# Patient Record
Sex: Female | Born: 2000 | Race: Black or African American | Hispanic: No | Marital: Single
Health system: Midwestern US, Community
[De-identification: ages and names within clinical notes are randomized; demographics above are authoritative.]

## PROBLEM LIST (undated history)

## (undated) ENCOUNTER — Inpatient Hospital Stay (HOSPITAL_COMMUNITY): Payer: Self-pay

## (undated) DIAGNOSIS — Z789 Other specified health status: Secondary | ICD-10-CM

## (undated) HISTORY — PX: NO PAST SURGERIES: SHX2092

---
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
## 2022-01-07 ENCOUNTER — Ambulatory Visit (HOSPITAL_COMMUNITY)
Admission: EM | Admit: 2022-01-07 | Discharge: 2022-01-07 | Disposition: A | Discharge status: Home / Self Care | Payer: Medicaid Other | Attending: Nurse Practitioner | Admitting: Nurse Practitioner

## 2022-01-07 ENCOUNTER — Encounter (HOSPITAL_COMMUNITY): Payer: Self-pay | Admitting: Emergency Medicine

## 2022-01-07 ENCOUNTER — Other Ambulatory Visit: Payer: Self-pay

## 2022-01-07 DIAGNOSIS — M545 Low back pain, unspecified: Secondary | ICD-10-CM | POA: Diagnosis present

## 2022-01-07 DIAGNOSIS — R110 Nausea: Secondary | ICD-10-CM | POA: Diagnosis not present

## 2022-01-07 DIAGNOSIS — R519 Headache, unspecified: Secondary | ICD-10-CM | POA: Diagnosis present

## 2022-01-07 LAB — POCT URINALYSIS DIPSTICK, ED / UC
Glucose, UA: NEGATIVE mg/dL
Hgb urine dipstick: NEGATIVE
Ketones, ur: >=160 mg/dL — AB
Leukocytes,Ua: NEGATIVE
Nitrite: NEGATIVE
Protein, ur: 100 mg/dL — AB
Specific Gravity, Urine: 1.025 (ref 1.005–1.030)
Urobilinogen, UA: 0.2 mg/dL (ref 0.0–1.0)
pH: 6 (ref 5.0–8.0)

## 2022-01-07 LAB — POC URINE PREG, ED: Preg Test, Ur: NEGATIVE

## 2022-01-07 LAB — POC INFLUENZA A AND B ANTIGEN (URGENT CARE ONLY)
INFLUENZA A ANTIGEN, POC: NEGATIVE
INFLUENZA B ANTIGEN, POC: NEGATIVE

## 2022-01-07 MED ORDER — IBUPROFEN 600 MG PO TABS: 600.0000 mg | ORAL_TABLET | Freq: Four times a day (QID) | ORAL | 0 refills | Status: AC | PRN

## 2022-01-07 MED ORDER — ONDANSETRON 4 MG PO TBDP: 4.0000 mg | ORAL_TABLET | Freq: Three times a day (TID) | ORAL | 0 refills | Status: DC | PRN

## 2022-01-07 MED ORDER — METRONIDAZOLE 0.75 % VA GEL: 1.0000 | Freq: Every day | VAGINAL | 0 refills | Status: AC

## 2022-01-07 NOTE — Discharge Instructions (Addendum)
Negative influenza test today. ?You will be contacted if your cytology results are positive. ?Take medication as prescribed.  Take ibuprofen with food and water to protect stomach lining. ?Increase fluids and get plenty of rest.   ?It is important that you try to start eating.  I recommend a bland diet until your nausea improves to include things like soups, broths, bananas, rice, applesauce or toast. ?Follow-up if symptoms do not improve. ? ?

## 2022-01-07 NOTE — ED Provider Notes (Addendum)
[facility]    CSN: [id number] Arrival date & time: 01/07/22  7622      History   Chief Complaint Chief Complaint  Patient presents with   Abdominal Pain   Nausea   Headache   Back Pain    HPI Janice Huynh is a 21 y.o. female.   The patient is a 21 year old female who presents with headache, nausea, and back pain for 1 day.  She also has complaints of lower abdominal pain and vaginal discharge for 1 day.  She denies fever, chills, cough, nasal congestion, runny nose.  States that she has not been able to eat all day today.  She has not taken any medication for her symptoms.  She denies any sick contacts.  Pain is located in the lower back.  She also reports an onset of vaginal discharge since yesterday.  She describes the discharge as white and the discharge has a foul smelling odor.  She has vaginal itching and abdominal cramping.  She has had the Nexplanon for more than 3 months but less than 6 months.  She denies any complications with this method of birth control.  She is currently sexually active.  She has had 1 female partner in the last 90 days with 0% condom use.  She is currently on the Nexplanon implant.  She does have a history of chlamydia.   Abdominal Pain Associated symptoms: fatigue, nausea and vaginal discharge   Associated symptoms: no dysuria, no fever, no sore throat and no vomiting   Headache Associated symptoms: abdominal pain, back pain, fatigue and nausea   Associated symptoms: no congestion, no fever, no sore throat and no vomiting   Back Pain Associated symptoms: abdominal pain and headaches   Associated symptoms: no dysuria and no fever    Past Medical History:  Diagnosis Date   Medical history non-contributory     Patient Active Problem List   Diagnosis Date Noted   IDA (iron deficiency anemia) 05/19/2020   Postpartum care following vaginal delivery 7/17 05/18/2020   SVD (spontaneous vaginal delivery) 05/18/2020   Perineal  laceration, second degree 05/18/2020   Hypokalemia 05/18/2020   Gestational (pregnancy-induced) hypertension without significant proteinuria, complicating childbirth 05/17/2020   Bilateral kidney stones 01/25/2020   Bacterial vaginosis 10/15/2018    Past Surgical History:  Procedure Laterality Date   NO PAST SURGERIES      OB History     Gravida  2   Para  1   Term  1   Preterm      AB  1   Living  1      SAB  1   IAB      Ectopic      Multiple  0   Live Births  1            Home Medications    Prior to Admission medications   Medication Sig Start Date End Date Taking? Authorizing Provider  ibuprofen (ADVIL) 600 MG tablet Take 1 tablet (600 mg total) by mouth every 6 (six) hours as needed for up to 7 days. 01/07/22 01/14/22 Yes Trina Asch-Warren, Sadie Haber, NP  ondansetron (ZOFRAN-ODT) 4 MG disintegrating tablet Take 1 tablet (4 mg total) by mouth every 8 (eight) hours as needed for up to 7 days for nausea or vomiting. 01/07/22 01/14/22 Yes Sundeep Cary-Warren, Sadie Haber, NP  benzonatate (TESSALON PERLES) 100 MG capsule Take 1 capsule (100 mg total) by mouth 3 (three) times daily as needed for cough (  cough). 04/15/21   Muthersbaugh, Dahlia ClientHannah, PA-C  cephALEXin (KEFLEX) 500 MG capsule Take 1 capsule (500 mg total) by mouth 3 (three) times daily. 10/09/21   Raspet, Noberto RetortErin K, PA-C  fluticasone (FLONASE) 50 MCG/ACT nasal spray Place 2 sprays into both nostrils daily. 04/15/21   Muthersbaugh, Dahlia ClientHannah, PA-C  iron polysaccharides (NIFEREX) 150 MG capsule Take 1 capsule (150 mg total) by mouth daily. 05/20/20   Crumpler, Lilyan GilfordJennifer B, CNM  metroNIDAZOLE (FLAGYL) 500 MG tablet Take 1 tablet (500 mg total) by mouth 2 (two) times daily. 12/29/21   Lamptey, Britta MccreedyPhilip O, MD    Family History Family History  Problem Relation Age of Onset   Hypertension Mother    Multiple sclerosis Mother     Social History Social History   Tobacco Use   Smoking status: Never   Smokeless tobacco: Never  Vaping  Use   Vaping Use: Never used  Substance Use Topics   Alcohol use: No   Drug use: Never     Allergies   Patient has no known allergies.   Review of Systems Review of Systems  Constitutional:  Positive for activity change, appetite change and fatigue. Negative for fever.  HENT:  Negative for congestion and sore throat.   Eyes: Negative.   Respiratory: Negative.    Cardiovascular: Negative.   Gastrointestinal:  Positive for abdominal pain and nausea. Negative for vomiting.  Genitourinary:  Positive for vaginal discharge. Negative for dysuria, enuresis and frequency.  Musculoskeletal:  Positive for back pain.  Skin: Negative.   Neurological:  Positive for headaches.  Psychiatric/Behavioral: Negative.      Physical Exam Triage Vital Signs ED Triage Vitals  Enc Vitals Group     BP 01/07/22 1953 110/63     Pulse Rate 01/07/22 1953 90     Resp 01/07/22 1953 16     Temp --      Temp src --      SpO2 01/07/22 1953 100 %     Weight 01/07/22 1954 154 lb 15.7 oz (70.3 kg)     Height 01/07/22 1954 5\' 3"  (1.6 m)     Head Circumference --      Peak Flow --      Pain Score 01/07/22 1954 6     Pain Loc --      Pain Edu? --      Excl. in [place]? --    No data found.  Updated Vital Signs BP 110/63 (BP Location: Right Arm)    Pulse 90    Resp 16    Ht 5\' 3"  (1.6 m)    Wt 154 lb 15.7 oz (70.3 kg)    SpO2 100%    BMI 27.45 kg/m   Visual Acuity Right Eye Distance:   Left Eye Distance:   Bilateral Distance:    Right Eye Near:   Left Eye Near:    Bilateral Near:     Physical Exam Constitutional:      General: She is not in acute distress.    Appearance: She is well-developed.  HENT:     Head: Normocephalic and atraumatic.     Mouth/Throat:     Mouth: Mucous membranes are moist.     Pharynx: No oropharyngeal exudate.  Eyes:     Extraocular Movements: Extraocular movements intact.     Pupils: Pupils are equal, round, and reactive to light.  Cardiovascular:     Rate and  Rhythm: Normal rate and regular rhythm.     Heart sounds:  Normal heart sounds.  Pulmonary:     Effort: Pulmonary effort is normal. No respiratory distress.     Breath sounds: Normal breath sounds.  Abdominal:     General: Bowel sounds are normal. There is no distension.     Palpations: Abdomen is soft.     Tenderness: There is no abdominal tenderness.  Genitourinary:    Comments: Deferred. Self-swab performed by patient. Skin:    General: Skin is warm and dry.  Neurological:     Mental Status: She is alert and oriented to person, place, and time.  Psychiatric:        Mood and Affect: Mood normal.        Behavior: Behavior normal.     UC Treatments / Results  Labs (all labs ordered are listed, but only abnormal results are displayed) Labs Reviewed  POCT URINALYSIS DIPSTICK, ED / UC - Abnormal; Notable for the following components:      Result Value   Bilirubin Urine SMALL (*)    Ketones, ur >=160 (*)    Protein, ur 100 (*)    All other components within normal limits  POC URINE PREG, ED  POC INFLUENZA A AND B ANTIGEN (URGENT CARE ONLY)  CERVICOVAGINAL ANCILLARY ONLY    EKG   Radiology No results found.  Procedures Procedures (including critical care time)  Medications Ordered in UC Medications - No data to display  Initial Impression / Assessment and Plan / UC Course  I have reviewed the triage vital signs and the nursing notes.  Pertinent labs & imaging results that were available during my care of the patient were reviewed by me and considered in my medical decision making (see chart for details).  Negative influenza test today.  Patient without fever, chills, abdominal pain, or vomiting.  No concern for acute abdomen at this time.  Symptoms are generalized in nature and difficult to determine an exact etiology of her symptoms.  We will treat with Zofran to help with her nausea.  Patient encouraged to increase fluids, and hopefully this will help her start  eating.  She has not eaten all day, which is reflected in her urinalysis.  With regard to her vaginal symptoms, will wait for cytology result.  Patient was recently treated for bacterial vaginosis on 12/25/2021, will wait to see if she still has bacterial vaginosis prior to starting another round of this medication.  Patient will be contacted once her cytology results are received if they are positive.  If necessary, we will treat at that time.  In the interim recommend increasing condom use.  Follow-up if symptoms do not improve.  At time of discharge, patient was advised that cytology results will be received prior to prescribing medication for BV as she was just prescribed medication on 12/25/2021.  Patient then advised that she never started the medication because she did not like the way that the medication taste.  Advised that I will send in a prescription for the vaginal gel for BV.  Instructions provided to patient for administration. Final Clinical Impressions(s) / UC Diagnoses   Final diagnoses:  Nausea  Nonintractable headache, unspecified chronicity pattern, unspecified headache type  Acute low back pain without sciatica, unspecified back pain laterality     Discharge Instructions      Negative influenza test today. You will be contacted if your cytology results are positive. Take medication as prescribed.  Take ibuprofen with food and water to protect stomach lining. Increase fluids and get plenty of  rest.   It is important that you try to start eating.  I recommend a bland diet until your nausea improves to include things like soups, broths, bananas, rice, applesauce or toast. Follow-up if symptoms do not improve.      ED Prescriptions     Medication Sig Dispense Auth. Provider   ondansetron (ZOFRAN-ODT) 4 MG disintegrating tablet Take 1 tablet (4 mg total) by mouth every 8 (eight) hours as needed for up to 7 days for nausea or vomiting. 20 tablet [place] Christman-Warren, Sadie Haber, NP    ibuprofen (ADVIL) 600 MG tablet Take 1 tablet (600 mg total) by mouth every 6 (six) hours as needed for up to 7 days. 21 tablet Carr[place] Lightcap-Warren, Sadie Haber, NP      PDMP not reviewed this encounter.   Abran Cantor, NP 01/07/22 2046    Abran Cantor, NP 01/07/22 2051

## 2022-01-07 NOTE — ED Triage Notes (Signed)
Pt reports abdominal pain with nausea, headache and back pain since yesterday. Pt also reports a white discharge with odor x 1 day.  ?

## 2022-01-08 LAB — CERVICOVAGINAL ANCILLARY ONLY
Bacterial Vaginitis (gardnerella): POSITIVE — AB
Candida Glabrata: NEGATIVE
Candida Vaginitis: NEGATIVE
Chlamydia: NEGATIVE
Comment: NEGATIVE
Comment: NEGATIVE
Comment: NEGATIVE
Comment: NEGATIVE
Comment: NEGATIVE
Comment: NORMAL
Neisseria Gonorrhea: NEGATIVE
Trichomonas: NEGATIVE

## 2022-01-09 ENCOUNTER — Other Ambulatory Visit: Payer: Self-pay

## 2022-01-09 ENCOUNTER — Emergency Department (HOSPITAL_COMMUNITY)
Admission: EM | Admit: 2022-01-09 | Discharge: 2022-01-09 | Disposition: A | Discharge status: Home / Self Care | Payer: Medicaid Other | Attending: Emergency Medicine | Admitting: Emergency Medicine

## 2022-01-09 ENCOUNTER — Encounter (HOSPITAL_COMMUNITY): Payer: Self-pay

## 2022-01-09 ENCOUNTER — Emergency Department (HOSPITAL_COMMUNITY): Payer: Medicaid Other

## 2022-01-09 DIAGNOSIS — N9489 Other specified conditions associated with female genital organs and menstrual cycle: Secondary | ICD-10-CM | POA: Diagnosis not present

## 2022-01-09 DIAGNOSIS — K5900 Constipation, unspecified: Secondary | ICD-10-CM | POA: Diagnosis not present

## 2022-01-09 DIAGNOSIS — R519 Headache, unspecified: Secondary | ICD-10-CM

## 2022-01-09 DIAGNOSIS — Z20822 Contact with and (suspected) exposure to covid-19: Secondary | ICD-10-CM | POA: Diagnosis not present

## 2022-01-09 DIAGNOSIS — R630 Anorexia: Secondary | ICD-10-CM | POA: Insufficient documentation

## 2022-01-09 DIAGNOSIS — N83201 Unspecified ovarian cyst, right side: Secondary | ICD-10-CM | POA: Diagnosis not present

## 2022-01-09 DIAGNOSIS — R109 Unspecified abdominal pain: Secondary | ICD-10-CM | POA: Diagnosis present

## 2022-01-09 LAB — CBC WITH DIFFERENTIAL/PLATELET
Abs Immature Granulocytes: 0.01 10*3/uL (ref 0.00–0.07)
Basophils Absolute: 0 10*3/uL (ref 0.0–0.1)
Basophils Relative: 0 %
Eosinophils Absolute: 0 10*3/uL (ref 0.0–0.5)
Eosinophils Relative: 0 %
HCT: 42.6 % (ref 36.0–46.0)
Hemoglobin: 14.6 g/dL (ref 12.0–15.0)
Immature Granulocytes: 0 %
Lymphocytes Relative: 19 %
Lymphs Abs: 1.2 10*3/uL (ref 0.7–4.0)
MCH: 29.2 pg (ref 26.0–34.0)
MCHC: 34.3 g/dL (ref 30.0–36.0)
MCV: 85.2 fL (ref 80.0–100.0)
Monocytes Absolute: 0.9 10*3/uL (ref 0.1–1.0)
Monocytes Relative: 13 %
Neutro Abs: 4.4 10*3/uL (ref 1.7–7.7)
Neutrophils Relative %: 68 %
Platelets: 188 10*3/uL (ref 150–400)
RBC: 5 MIL/uL (ref 3.87–5.11)
RDW: 12.8 % (ref 11.5–15.5)
WBC: 6.4 10*3/uL (ref 4.0–10.5)
nRBC: 0 % (ref 0.0–0.2)

## 2022-01-09 LAB — COMPREHENSIVE METABOLIC PANEL
ALT: 33 U/L (ref 0–44)
AST: 36 U/L (ref 15–41)
Albumin: 3.8 g/dL (ref 3.5–5.0)
Alkaline Phosphatase: 54 U/L (ref 38–126)
Anion gap: 10 (ref 5–15)
BUN: 10 mg/dL (ref 6–20)
CO2: 22 mmol/L (ref 22–32)
Calcium: 8.7 mg/dL — ABNORMAL LOW (ref 8.9–10.3)
Chloride: 103 mmol/L (ref 98–111)
Creatinine, Ser: 0.87 mg/dL (ref 0.44–1.00)
GFR, Estimated: >60 mL/min (ref >60)
Glucose, Bld: 90 mg/dL (ref 70–99)
Potassium: 3.5 mmol/L (ref 3.5–5.1)
Sodium: 135 mmol/L (ref 135–145)
Total Bilirubin: 0.6 mg/dL (ref 0.3–1.2)
Total Protein: 7.1 g/dL (ref 6.5–8.1)

## 2022-01-09 LAB — RESP PANEL BY RT-PCR (FLU A&B, COVID) ARPGX2
Influenza A by PCR: NEGATIVE
Influenza B by PCR: NEGATIVE
SARS Coronavirus 2 by RT PCR: NEGATIVE

## 2022-01-09 LAB — I-STAT BETA HCG BLOOD, ED (MC, WL, AP ONLY): I-stat hCG, quantitative: <5 m[IU]/mL (ref <5)

## 2022-01-09 LAB — LIPASE, BLOOD: Lipase: 26 U/L (ref 11–51)

## 2022-01-09 MED ORDER — DIPHENHYDRAMINE HCL 50 MG/ML IJ SOLN
12.5000 mg | Freq: Once | INTRAMUSCULAR | Status: AC
  Administered 2022-01-09: 12.5 mg via INTRAVENOUS
  Filled 2022-01-09: qty 1

## 2022-01-09 MED ORDER — METOCLOPRAMIDE HCL 5 MG/ML IJ SOLN
10.0000 mg | Freq: Once | INTRAMUSCULAR | Status: AC
  Administered 2022-01-09: 10 mg via INTRAVENOUS
  Filled 2022-01-09: qty 2

## 2022-01-09 MED ORDER — KETOROLAC TROMETHAMINE 15 MG/ML IJ SOLN
15.0000 mg | Freq: Once | INTRAMUSCULAR | Status: AC
Start: 2022-01-09 — End: 2022-01-09
  Administered 2022-01-09: 15 mg via INTRAVENOUS
  Filled 2022-01-09: qty 1

## 2022-01-09 MED ORDER — IOHEXOL 300 MG/ML  SOLN
100.0000 mL | Freq: Once | INTRAMUSCULAR | Status: AC | PRN
  Administered 2022-01-09: 100 mL via INTRAVENOUS

## 2022-01-09 MED ORDER — SODIUM CHLORIDE 0.9 % IV BOLUS
1000.0000 mL | Freq: Once | INTRAVENOUS | Status: AC
Start: 2022-01-09 — End: 2022-01-09
  Administered 2022-01-09: 1000 mL via INTRAVENOUS

## 2022-01-09 NOTE — Discharge Instructions (Addendum)
You are seen in the ER today for your headache and your abdominal pain.  Your physical exam and vital signs were reassuring as was your blood work and your CT scan.  Your headache improved with medications in the ER.  You may take Tylenol or Motrin as needed for headache.  Please proceed with treatment for bacterial vaginosis as previously prescribed.  Follow-up with your primary care doctor and return to the ER with any new severe symptoms. ?

## 2022-01-09 NOTE — ED Provider Notes (Cosign Needed)
[facility]-EMERGENCY DEPT Provider Note   CSN: [id number] Arrival date & time: 01/09/22  1324     History  Chief Complaint  Patient presents with   Headache   Back Pain   Abdominal Cramping    Janice Huynh is a 21 y.o. female.  21 year old female presents today for evaluation of abdominal pain, and headache.  Patient was recently evaluated at urgent care 2 days ago for same complaints.  She had an STI panel collected at that time which was positive for bacterial vaginosis.  She was prescribed treatment which she has not started at this point.  She denies progression of her symptoms.  She also states she is constipated.  She states she tried a suppository over-the-counter but does not believe that I am for adequate time.  She does endorse decreased p.o. intake.  She denies fever, lightheadedness, palpitations, chest pain, shortness of breath, or vomiting.  She endorses nausea.  She states she became nauseous last night and took prescribed Zofran with relief.   The history is provided by the patient. No language interpreter was used.      Home Medications Prior to Admission medications   Medication Sig Start Date End Date Taking? Authorizing Provider  benzonatate (TESSALON PERLES) 100 MG capsule Take 1 capsule (100 mg total) by mouth 3 (three) times daily as needed for cough (cough). 04/15/21   Muthersbaugh, Dahlia Client, PA-C  cephALEXin (KEFLEX) 500 MG capsule Take 1 capsule (500 mg total) by mouth 3 (three) times daily. 10/09/21   Raspet, Noberto Retort, PA-C  fluticasone (FLONASE) 50 MCG/ACT nasal spray Place 2 sprays into both nostrils daily. 04/15/21   Muthersbaugh, Dahlia Client, PA-C  ibuprofen (ADVIL) 600 MG tablet Take 1 tablet (600 mg total) by mouth every 6 (six) hours as needed for up to 7 days. 01/07/22 01/14/22  Leath-Warren, Sadie Haber, NP  iron polysaccharides (NIFEREX) 150 MG capsule Take 1 capsule (150 mg total) by mouth daily. 05/20/20   Crumpler, Lilyan Gilford, CNM   metroNIDAZOLE (FLAGYL) 500 MG tablet Take 1 tablet (500 mg total) by mouth 2 (two) times daily. 12/29/21   Lamptey, Britta Mccreedy, MD  metroNIDAZOLE (METROGEL) 0.75 % vaginal gel Place 1 Applicatorful vaginally at bedtime for 5 days. 01/07/22 01/12/22  Leath-Warren, Sadie Haber, NP  ondansetron (ZOFRAN-ODT) 4 MG disintegrating tablet Take 1 tablet (4 mg total) by mouth every 8 (eight) hours as needed for up to 7 days for nausea or vomiting. 01/07/22 01/14/22  Leath-Warren, Sadie Haber, NP      Allergies    Patient has no known allergies.    Review of Systems   Review of Systems  Constitutional:  Negative for activity change, chills and fever.  Respiratory:  Negative for shortness of breath.   Cardiovascular:  Negative for chest pain and palpitations.  Gastrointestinal:  Positive for abdominal pain, constipation and nausea. Negative for abdominal distention and vomiting.  Genitourinary:  Positive for flank pain and vaginal discharge. Negative for difficulty urinating, dysuria, vaginal bleeding and vaginal pain.  Neurological:  Negative for weakness and light-headedness.  All other systems reviewed and are negative.  Physical Exam Updated Vital Signs BP (!) 104/57 (BP Location: Right Arm)    Pulse 90    Temp 98.5 F (36.9 C) (Oral)    Resp 16    Ht 5\' 3"  (1.6 m)    Wt 70.3 kg    SpO2 98%    BMI 27.45 kg/m  Physical Exam Vitals and nursing note reviewed.  Constitutional:      General: She is not in acute distress.    Appearance: Normal appearance. She is not ill-appearing.  HENT:     Head: Normocephalic and atraumatic.     Nose: Nose normal.  Eyes:     General: No scleral icterus.    Extraocular Movements: Extraocular movements intact.     Conjunctiva/sclera: Conjunctivae normal.  Cardiovascular:     Rate and Rhythm: Normal rate and regular rhythm.     Pulses: Normal pulses.     Heart sounds: Normal heart sounds.  Pulmonary:     Effort: Pulmonary effort is normal. No respiratory distress.      Breath sounds: Normal breath sounds. No wheezing or rales.  Abdominal:     General: There is no distension.     Palpations: Abdomen is soft.     Tenderness: There is abdominal tenderness (Diffuse abdominal tenderness). There is no right CVA tenderness, left CVA tenderness or guarding.  Musculoskeletal:        General: Normal range of motion.     Cervical back: Normal range of motion.     Right lower leg: No edema.     Left lower leg: No edema.  Skin:    General: Skin is warm and dry.  Neurological:     General: No focal deficit present.     Mental Status: She is alert. Mental status is at baseline.     Comments: Cranial nerves II through XII intact.  EOM intact.  Strength in bilateral lower extremities 5/5.  Sensation intact.  Without focal deficits.    ED Results / Procedures / Treatments   Labs (all labs ordered are listed, but only abnormal results are displayed) Labs Reviewed  RESP PANEL BY RT-PCR (FLU A&B, COVID) ARPGX2  CBC WITH DIFFERENTIAL/PLATELET  COMPREHENSIVE METABOLIC PANEL  LIPASE, BLOOD    EKG None  Radiology No results found.  Procedures Procedures    Medications Ordered in ED Medications  sodium chloride 0.9 % bolus 1,000 mL (has no administration in time range)  ketorolac (TORADOL) 15 MG/ML injection 15 mg (has no administration in time range)  diphenhydrAMINE (BENADRYL) injection 12.5 mg (has no administration in time range)  metoCLOPramide (REGLAN) injection 10 mg (has no administration in time range)    ED Course/ Medical Decision Making/ A&P                           Medical Decision Making Amount and/or Complexity of Data Reviewed Labs: ordered.  Risk Prescription drug management.   Medical Decision Making / ED Course   This patient presents to the ED for concern of abdominal pain, vaginal discharge, headache, this involves an extensive number of treatment options, and is a complaint that carries with it a high risk of complications  and morbidity.  The differential diagnosis includes migraine, appendicitis, pancreatitis, constipation, PID.  PID less likely given she was negative for gonorrhea, and chlamydia.  She is positive for bacterial vaginosis and has not started on treatment.  MDM: 21 year old female presents today for evaluation of abdominal pain, headache, nausea, vaginal discharge of a few day duration.  Patient was recently evaluated at urgent care 3/8.  She has not followed through with BV treatment.  She states she plans on doing that tonight.  She endorses constipation.  She has tried anything over-the-counter for constipation.  Endorses decreased hydration.  Without fever.  Has remained afebrile and with stable vital signs in the  emergency room.  Will provide IV hydration, obtain basic labs, respiratory panel, migraine cocktail, and reevaluate.  Will evaluate with CT abdomen pelvis.   Additional history obtained: -Additional history obtained from recent urgent care visit.  Patient had STI panel which was negative for chlamydia, gonorrhea, trichomonas, and positive for BV. -External records from outside source obtained and reviewed including: Chart review including previous notes, labs, imaging, consultation notes   Lab Tests: -I ordered, reviewed, and interpreted labs.   The pertinent results include:   Labs Reviewed  RESP PANEL BY RT-PCR (FLU A&B, COVID) ARPGX2  CBC WITH DIFFERENTIAL/PLATELET  COMPREHENSIVE METABOLIC PANEL  LIPASE, BLOOD      EKG  EKG Interpretation  Date/Time:    Ventricular Rate:    PR Interval:    QRS Duration:   QT Interval:    QTC Calculation:   R Axis:     Text Interpretation:           Imaging Studies ordered: I ordered imaging studies including CT abdomen pelvis I independently visualized and interpreted imaging. I agree with the radiologist interpretation   Medicines ordered and prescription drug management: Meds ordered this encounter  Medications   sodium  chloride 0.9 % bolus 1,000 mL   ketorolac (TORADOL) 15 MG/ML injection 15 mg   diphenhydrAMINE (BENADRYL) injection 12.5 mg   metoCLOPramide (REGLAN) injection 10 mg    -I have reviewed the patients home medicines and have made adjustments as needed  Cardiac Monitoring: The patient was maintained on a cardiac monitor.  I personally viewed and interpreted the cardiac monitored which showed an underlying rhythm of: Normal sinus rhythm  Social Determinants of Health:  Factors impacting patients care include: She does not have PCP.  We will provide her with resources to call community health and wellness clinic  Reevaluation: After the interventions noted above, I reevaluated the patient and found that they have :improved Improvement in headache following migraine cocktail.  Co morbidities that complicate the patient evaluation  Past Medical History:  Diagnosis Date   Medical history non-contributory       Dispostion: Patient at the end of shift is awaiting CT abdomen pelvis with contrast.  [facility] for follow-up and appropriate disposition.  Final Clinical Impression(s) / ED Diagnoses Final diagnoses:  None    Rx / DC Orders ED Discharge Orders     None         Marita Kansasli, Genee Rann, PA-C 01/09/22 1539

## 2022-01-09 NOTE — ED Triage Notes (Signed)
Patient c/o lower abdominal cramping, nausea, headache, and states she was treated for BV when she went to [facility] on 01/07/22. ?

## 2022-01-09 NOTE — ED Provider Notes (Signed)
?Physical Exam  ?BP 107/65   Pulse 80   Temp 98.5 ?F (36.9 ?C) (Oral)   Resp 16   Ht 5\' 3"  (1.6 m)   Wt 70.3 kg   SpO2 100%   BMI 27.45 kg/m?  ? ?Physical Exam ?Vitals and nursing note reviewed.  ?Constitutional:   ?   Appearance: She is not ill-appearing or toxic-appearing.  ?HENT:  ?   Head: Normocephalic and atraumatic.  ?Eyes:  ?   General: No scleral icterus.    ?   Right eye: No discharge.     ?   Left eye: No discharge.  ?   Conjunctiva/sclera: Conjunctivae normal.  ?Cardiovascular:  ?   Rate and Rhythm: Normal rate.  ?Pulmonary:  ?   Effort: Pulmonary effort is normal.  ?Abdominal:  ?   General: There is no distension.  ?   Palpations: Abdomen is soft.  ?   Tenderness: There is no abdominal tenderness. There is no right CVA tenderness, left CVA tenderness, guarding or rebound.  ?Skin: ?   General: Skin is warm and dry.  ?   Capillary Refill: Capillary refill takes less than 2 seconds.  ?Neurological:  ?   General: No focal deficit present.  ?   Mental Status: She is alert.  ?Psychiatric:     ?   Mood and Affect: Mood normal.  ? ? ?Procedures  ?Procedures ? ?ED Course / MDM  ? ?Clinical Course as of 01/09/22 1932  ?Fri Jan 09, 2022  ?1751 CT ABDOMEN PELVIS W CONTRAST [RS]  ?  ?Clinical Course User Index ?[RS] Cacey Willow, Gypsy Balsam, PA-C  ? ?Medical Decision Making ?21 year old female with headache and abdominal pain and known BP who presents for evaluation in the emergency department. ? ?Vital signs are normal and intake.  Cardiopulmonary exam is normal abdominal exam is benign at time of my reevaluation of the patient. ? ?Amount and/or Complexity of Data Reviewed ?Labs: ordered. ?   Details: CBC without leukocytosis or anemia.  CMP unremarkable.  Respiratory pathogen panel is negative.  Patient is not pregnant and lipase is normal. ?Radiology: ordered. Decision-making details documented in ED Course. ?   Details: CT of the abdomen pelvis is negative for acute intraabdominal abnormality.  There is a 2  cm cyst in the right ovary which is simple appearing. ? ?Risk ?Prescription drug management. ? ? ?Care of this patient assumed from preceding ED provider A. Deatra Canter, PA-C at time of shift change.  Please see his associated note further insight of the patient's ED course.  In brief patient presents for abdominal pain and headache.  Recent pan negative STI screening but positive for BV for which she has not yet started treatment.  No nausea/vomiting/diarrhea.  At time of shift change awaiting labs and CT scan. ? ?Patient reevaluated in the emergency department following CT scan results with significant improvement in her headache and abdominal pain.  States she feels well at this time and is ready to be discharged home.  Repeat abdominal exam is benign.  No further work-up warranted near this time.  With exact etiology of patient's symptoms remains unclear, clinical concern for underlying emergent etiology that would warrant further ED work-up or inpatient management is exceedingly low. ? ?Jaide voiced understanding of her medical evaluation and treatment plan.  Each of her questions answered to her expressed satisfaction.  Return precautions are given.  Patient is well-appearing, stable, and was discharged in good condition. ? ? ? ?This chart was dictated using  voice recognition software, Diplomatic Services operational officer. Despite the best efforts of this provider to proofread and correct errors, errors may still occur which can change documentation meaning. ? ?  ?Emeline Darling, PA-C ?01/09/22 1936 ? ?  ?Tegeler, Gwenyth Allegra, MD ?01/12/22 1619 ? ?

## 2022-01-11 ENCOUNTER — Emergency Department (HOSPITAL_COMMUNITY)
Admission: EM | Admit: 2022-01-11 | Discharge: 2022-01-11 | Disposition: A | Discharge status: Home / Self Care | Payer: Medicaid Other | Attending: Emergency Medicine | Admitting: Emergency Medicine

## 2022-01-11 ENCOUNTER — Other Ambulatory Visit: Payer: Self-pay

## 2022-01-11 ENCOUNTER — Encounter (HOSPITAL_COMMUNITY): Payer: Self-pay

## 2022-01-11 ENCOUNTER — Emergency Department (HOSPITAL_COMMUNITY): Payer: Medicaid Other

## 2022-01-11 DIAGNOSIS — R809 Proteinuria, unspecified: Secondary | ICD-10-CM | POA: Insufficient documentation

## 2022-01-11 DIAGNOSIS — R1030 Lower abdominal pain, unspecified: Secondary | ICD-10-CM

## 2022-01-11 DIAGNOSIS — B9689 Other specified bacterial agents as the cause of diseases classified elsewhere: Secondary | ICD-10-CM

## 2022-01-11 DIAGNOSIS — R102 Pelvic and perineal pain unspecified side: Secondary | ICD-10-CM

## 2022-01-11 DIAGNOSIS — E876 Hypokalemia: Secondary | ICD-10-CM | POA: Diagnosis not present

## 2022-01-11 DIAGNOSIS — N760 Acute vaginitis: Secondary | ICD-10-CM

## 2022-01-11 LAB — CBC WITH DIFFERENTIAL/PLATELET
Abs Immature Granulocytes: 0.01 10*3/uL (ref 0.00–0.07)
Basophils Absolute: 0 10*3/uL (ref 0.0–0.1)
Basophils Relative: 0 %
Eosinophils Absolute: 0 10*3/uL (ref 0.0–0.5)
Eosinophils Relative: 0 %
HCT: 42.5 % (ref 36.0–46.0)
Hemoglobin: 14 g/dL (ref 12.0–15.0)
Immature Granulocytes: 0 %
Lymphocytes Relative: 18 %
Lymphs Abs: 0.8 10*3/uL (ref 0.7–4.0)
MCH: 28.3 pg (ref 26.0–34.0)
MCHC: 32.9 g/dL (ref 30.0–36.0)
MCV: 85.9 fL (ref 80.0–100.0)
Monocytes Absolute: 0.5 10*3/uL (ref 0.1–1.0)
Monocytes Relative: 12 %
Neutro Abs: 3.2 10*3/uL (ref 1.7–7.7)
Neutrophils Relative %: 70 %
Platelets: 168 10*3/uL (ref 150–400)
RBC: 4.95 MIL/uL (ref 3.87–5.11)
RDW: 12.6 % (ref 11.5–15.5)
WBC: 4.6 10*3/uL (ref 4.0–10.5)
nRBC: 0 % (ref 0.0–0.2)

## 2022-01-11 LAB — URINALYSIS, ROUTINE W REFLEX MICROSCOPIC
Bilirubin Urine: NEGATIVE
Glucose, UA: NEGATIVE mg/dL
Hgb urine dipstick: NEGATIVE
Ketones, ur: 5 mg/dL — AB
Leukocytes,Ua: NEGATIVE
Nitrite: NEGATIVE
Protein, ur: 100 mg/dL — AB
Specific Gravity, Urine: 1.021 (ref 1.005–1.030)
pH: 5 (ref 5.0–8.0)

## 2022-01-11 LAB — COMPREHENSIVE METABOLIC PANEL
ALT: 31 U/L (ref 0–44)
AST: 33 U/L (ref 15–41)
Albumin: 3.5 g/dL (ref 3.5–5.0)
Alkaline Phosphatase: 50 U/L (ref 38–126)
Anion gap: 10 (ref 5–15)
BUN: <5 mg/dL — ABNORMAL LOW (ref 6–20)
CO2: 20 mmol/L — ABNORMAL LOW (ref 22–32)
Calcium: 8.6 mg/dL — ABNORMAL LOW (ref 8.9–10.3)
Chloride: 106 mmol/L (ref 98–111)
Creatinine, Ser: 0.9 mg/dL (ref 0.44–1.00)
GFR, Estimated: >60 mL/min (ref >60)
Glucose, Bld: 100 mg/dL — ABNORMAL HIGH (ref 70–99)
Potassium: 3.3 mmol/L — ABNORMAL LOW (ref 3.5–5.1)
Sodium: 136 mmol/L (ref 135–145)
Total Bilirubin: 0.5 mg/dL (ref 0.3–1.2)
Total Protein: 6.7 g/dL (ref 6.5–8.1)

## 2022-01-11 LAB — WET PREP, GENITAL
Sperm: NONE SEEN
Trich, Wet Prep: NONE SEEN
WBC, Wet Prep HPF POC: >=10 — AB (ref <10)
Yeast Wet Prep HPF POC: NONE SEEN

## 2022-01-11 LAB — I-STAT BETA HCG BLOOD, ED (MC, WL, AP ONLY): I-stat hCG, quantitative: <5 m[IU]/mL (ref <5)

## 2022-01-11 LAB — LIPASE, BLOOD: Lipase: 23 U/L (ref 11–51)

## 2022-01-11 MED ORDER — ONDANSETRON 8 MG PO TBDP: 8.0000 mg | ORAL_TABLET | Freq: Three times a day (TID) | ORAL | 0 refills | Status: DC | PRN

## 2022-01-11 MED ORDER — KETOROLAC TROMETHAMINE 30 MG/ML IJ SOLN
30.0000 mg | Freq: Once | INTRAMUSCULAR | Status: AC
  Administered 2022-01-11: 30 mg via INTRAVENOUS
  Filled 2022-01-11: qty 1

## 2022-01-11 MED ORDER — ACETAMINOPHEN 500 MG PO TABS
1000.0000 mg | ORAL_TABLET | Freq: Once | ORAL | Status: AC
Start: 2022-01-11 — End: 2022-01-11
  Administered 2022-01-11: 1000 mg via ORAL
  Filled 2022-01-11: qty 2

## 2022-01-11 NOTE — Discharge Instructions (Addendum)
Continue taking Tylenol and Motrin for pain.  Follow-up with OBG tomorrow as scheduled.  Continue taking the Flagyl you are prescribed for the BV, you need to take it twice daily for 7 days to cure it.  I have prescribed you nausea medicine, you can take it every 8 hours as needed. ?

## 2022-01-11 NOTE — ED Provider Notes (Signed)
[facility] EMERGENCY DEPARTMENT Provider Note   CSN: [id number] Arrival date & time: 01/11/22  1126     History  Chief Complaint  Patient presents with   Abdominal Pain    Janice Huynh is a 21 y.o. female.   Abdominal Pain  Patient is a 21 year old female with no previous abdominal surgeries presenting today due to lower abdominal pain.  States it started about a week ago, she was seen at urgent care 6 days ago and tested positive for BV.  She has been using a vaginal cream but she was unable to tolerate the oral Flagyl.  Was seen in the ED 2 days ago due to worsening abdominal pain, at that time she had a CT scan which is notable for 2 cm right ovarian cyst.  She has an OB appointment tomorrow.  States that the pain is roughly the same as its been all week, its a constant cramping pain to the lower abdomen, sometimes it is to the left sometimes to the right.  No provoking features, she has tried Motrin which somewhat alleviates the pain.  Denies any vomiting, no fevers.  No new sexual partners.  Home Medications Prior to Admission medications   Medication Sig Start Date End Date Taking? Authorizing Provider  benzonatate (TESSALON PERLES) 100 MG capsule Take 1 capsule (100 mg total) by mouth 3 (three) times daily as needed for cough (cough). Patient not taking: Reported on 01/09/2022 04/15/21   Muthersbaugh, Jarrett Soho, PA-C  fluticasone [facility]) 50 MCG/ACT nasal spray Place 2 sprays into both nostrils daily. Patient not taking: Reported on 01/09/2022 04/15/21   Muthersbaugh, Jarrett Soho, PA-C  ibuprofen (ADVIL) 600 MG tablet Take 1 tablet (600 mg total) by mouth every 6 (six) hours as needed for up to 7 days. 01/07/22 01/14/22  Leath-Warren, Alda Lea, NP  iron polysaccharides (NIFEREX) 150 MG capsule Take 1 capsule (150 mg total) by mouth daily. Patient not taking: Reported on 01/09/2022 05/20/20   Crumpler, Marveen Reeks, CNM  metroNIDAZOLE (FLAGYL) 500 MG tablet Take 1 tablet  (500 mg total) by mouth 2 (two) times daily. Patient not taking: Reported on 01/09/2022 12/29/21   Florabelle Picket, MD  metroNIDAZOLE (METROGEL) 0.75 % vaginal gel Place 1 Applicatorful vaginally at bedtime for 5 days. Patient not taking: Reported on 01/09/2022 01/07/22 01/12/22  Leath-Warren, Alda Lea, NP  ondansetron (ZOFRAN-ODT) 4 MG disintegrating tablet Take 1 tablet (4 mg total) by mouth every 8 (eight) hours as needed for up to 7 days for nausea or vomiting. Patient not taking: Reported on 01/09/2022 01/07/22 01/14/22  Leath-Warren, Alda Lea, NP      Allergies    Patient has no known allergies.    Review of Systems   Review of Systems  Gastrointestinal:  Positive for abdominal pain.   Physical Exam Updated Vital Signs BP 113/62 (BP Location: Right Arm)    Pulse 100    Temp 100.2 F (37.9 C) (Oral)    Resp 17    Ht 5\' 2"  (1.575 m)    Wt 63.5 kg    SpO2 97%    BMI 25.61 kg/m  Physical Exam Vitals and nursing note reviewed. Exam conducted with a chaperone present.  Constitutional:      Appearance: Normal appearance.  HENT:     Head: Normocephalic and atraumatic.  Eyes:     General: No scleral icterus.       Right eye: No discharge.        Left eye: No  discharge.     Extraocular Movements: Extraocular movements intact.     Pupils: Pupils are equal, round, and reactive to light.  Cardiovascular:     Rate and Rhythm: Normal rate and regular rhythm.     Pulses: Normal pulses.     Heart sounds: Normal heart sounds. No murmur heard.   No friction rub. No gallop.  Pulmonary:     Effort: Pulmonary effort is normal. No respiratory distress.     Breath sounds: Normal breath sounds.  Abdominal:     General: Abdomen is flat. Bowel sounds are normal. There is no distension.     Palpations: Abdomen is soft.     Tenderness: There is no abdominal tenderness.  Genitourinary:    Vagina: Normal.     Cervix: Normal.     Adnexa:        Right: Tenderness present.        Left: Tenderness  present.   Skin:    General: Skin is warm and dry.     Coloration: Skin is not jaundiced.  Neurological:     Mental Status: She is alert. Mental status is at baseline.     Coordination: Coordination normal.    ED Results / Procedures / Treatments   Labs (all labs ordered are listed, but only abnormal results are displayed) Labs Reviewed  WET PREP, GENITAL  CBC WITH DIFFERENTIAL/PLATELET  COMPREHENSIVE METABOLIC PANEL  LIPASE, BLOOD  URINALYSIS, ROUTINE W REFLEX MICROSCOPIC  I-STAT BETA HCG BLOOD, ED ([facility], [facility], [facility] ONLY)  GC/CHLAMYDIA PROBE AMP ([place]) NOT AT [facility]    EKG None  Radiology CT ABDOMEN PELVIS W CONTRAST  Result Date: 01/09/2022 CLINICAL DATA:  Abdominal pain. EXAM: CT ABDOMEN AND PELVIS WITH CONTRAST TECHNIQUE: Multidetector CT imaging of the abdomen and pelvis was performed using the standard protocol following bolus administration of intravenous contrast. RADIATION DOSE REDUCTION: This exam was performed according to the departmental dose-optimization program which includes automated exposure control, adjustment of the mA and/or kV according to patient size and/or use of iterative reconstruction technique. CONTRAST:  115mL OMNIPAQUE IOHEXOL 300 MG/ML  SOLN COMPARISON:  None. FINDINGS: Lower chest: Negative. Hepatobiliary: No focal liver abnormality is seen. Small amount of focal fat along the falciform ligament. No gallstones, gallbladder wall thickening, or biliary dilatation. Pancreas: Unremarkable. No pancreatic ductal dilatation or surrounding inflammatory changes. Spleen: Normal in size without focal abnormality. Adrenals/Urinary Tract: Adrenal glands are unremarkable. Subcentimeter low-density lesion in the left kidney, too small to characterize, though likely a cyst. No renal calculus or hydronephrosis. The bladder is decompressed. Stomach/Bowel: Stomach is within normal limits. Appendix appears normal. No evidence of bowel wall thickening, distention, or  inflammatory changes. Vascular/Lymphatic: No significant vascular findings are present. No enlarged abdominal or pelvic lymph nodes. Reproductive: The uterus and left ovary are unremarkable. 2.2 cm simple appearing cyst in the right ovary. Other: Trace free fluid in the pelvis, likely physiologic. No pneumoperitoneum. Musculoskeletal: No acute or significant osseous findings. IMPRESSION: 1. No acute intra-abdominal process. 2. 2.2 cm simple appearing cyst in the right ovary. No follow-up imaging recommended. Note: This recommendation does not apply to premenarchal patients and to those with increased risk (genetic, family history, elevated tumor markers or other high-risk factors) of ovarian cancer. Reference: JACR 2020 Feb; 17(2):248-254 Electronically Signed   By: Titus Dubin M.D.   On: 01/09/2022 16:56    Procedures Procedures    Medications Ordered in ED Medications  ketorolac (TORADOL) 30 MG/ML injection 30 mg (has no administration  in time range)    ED Course/ Medical Decision Making/ A&P                           Medical Decision Making Amount and/or Complexity of Data Reviewed Labs: ordered. Radiology: ordered.  Risk OTC drugs. Prescription drug management.   This patient presents to the ED for concern of lower abdominal/pelvic pain, this involves an extensive number of treatment options, and is a complaint that carries with it a high risk of complications and morbidity.  The differential diagnosis includes ectopic, ovarian torsion, UTI, BV, pyelonephritis, ruptured ovarian cyst, other   Additional history obtained:    I reviewed patient's records.  She has been seen here multiple times in the last week, no pelvic exam was performed although her self swab was notable for BV.  She just are taking the antibiotics yesterday and has not taken any this morning.  Reviewed CT abdomen performed 2 days ago, simple appearing ovarian cyst noted but otherwise benign.    Lab  Tests:  I ordered, viewed, and personally interpreted labs.  The pertinent results include:   CBC without leukocytosis or anemia.  There is no gross electrolyte derangement on CMP although patient is mildly hypokalemic bulimic at 3.3.  Lipase within normal limits, patient is not pregnant.  GC chlamydia pending, wet prep notable for clue cells suggestive of BV.  UA with some proteinuria but no evidence of UTI    Imaging Studies ordered:  I directly visualized the pelvic ultrasound and ovarian torsion, which showed no acute process  I agree with the radiologist interpretation    ECG/Cardiac monitoring:    The patient was maintained on a cardiac monitor.  Visualized monitor strip which showed normal sinus rhythm with a heart rate of 99.  Per my interpretation.    Medicines ordered and prescription drug management:  I ordered medication including: Toradol, Tylenol    I have reviewed the patients home medicines and have made adjustments as needed    Reevaluation:  After the interventions noted above, I reevaluated the patient and found patient improved.   Problems addressed / ED Course: Patient presented with pelvic pain.  Her vitals are stable, her abdominal exam and pelvic exam relatively benign.  Ultrasound did not show any signs of acute process.  Patient reports improvement of her pain.  Reviewed CT abdomen performed 2 days ago, do not think repeat warranted.  Encourage patient to continue taking her Flagyl and to follow-up with her OB tomorrow as scheduled.  Discharged in stable condition.           Final Clinical Impression(s) / ED Diagnoses Final diagnoses:  None    Rx / DC Orders ED Discharge Orders     None         Sherrill Raring, Hershal Coria 01/11/22 1456    Davonna Belling, MD 01/11/22 2050

## 2022-01-11 NOTE — ED Notes (Signed)
Pt in bed, pt reports decreased pain, pt states that she is ready to go home. Verbalized understanding d/c and follow up.  ?

## 2022-01-11 NOTE — ED Triage Notes (Signed)
Pt to er room number 6 via ems, per ems pt was seen at urgent care a week ago and [facility] two days ago for the same, states that she was d/x with a cyst and has an appointment to follow up with OB tomorrow.  Pt ambulatory to bathroom for a urine sample.   ? ?Pt states that she has been having abd pain/ cramping and back pain for the past week.  Pt states that she had some vomiting but has not vomited today ?

## 2022-01-11 NOTE — ED Notes (Signed)
Pt to ultrasound

## 2022-01-12 LAB — GC/CHLAMYDIA PROBE AMP (~~LOC~~) NOT AT ARMC
Chlamydia: NEGATIVE
Comment: NEGATIVE
Comment: NORMAL
Neisseria Gonorrhea: NEGATIVE

## 2022-04-01 ENCOUNTER — Telehealth: Payer: Medicaid Other | Admitting: Physician Assistant

## 2022-04-01 DIAGNOSIS — B029 Zoster without complications: Secondary | ICD-10-CM | POA: Diagnosis not present

## 2022-04-01 MED ORDER — GABAPENTIN 100 MG PO CAPS: 100.0000 mg | ORAL_CAPSULE | Freq: Two times a day (BID) | ORAL | 0 refills | Status: DC

## 2022-04-01 MED ORDER — VALACYCLOVIR HCL 1 G PO TABS: 1000.0000 mg | ORAL_TABLET | Freq: Three times a day (TID) | ORAL | 0 refills | Status: AC

## 2022-04-01 NOTE — Patient Instructions (Signed)
  Janice Huynh, thank you for joining Leeanne Rio, PA-C for today's virtual visit.  While this provider is not your primary care provider (PCP), if your PCP is located in our provider database this encounter information will be shared with them immediately following your visit.  Consent: (Patient) Janice Huynh provided verbal consent for this virtual visit at the beginning of the encounter.  Current Medications:  Current Outpatient Medications:    gabapentin (NEURONTIN) 100 MG capsule, Take 1 capsule (100 mg total) by mouth 2 (two) times daily., Disp: 30 capsule, Rfl: 0   valACYclovir (VALTREX) 1000 MG tablet, Take 1 tablet (1,000 mg total) by mouth 3 (three) times daily for 7 days., Disp: 21 tablet, Rfl: 0   Medications ordered in this encounter:  Meds ordered this encounter  Medications   gabapentin (NEURONTIN) 100 MG capsule    Sig: Take 1 capsule (100 mg total) by mouth 2 (two) times daily.    Dispense:  30 capsule    Refill:  0    Order Specific Question:   Supervising Provider    Answer:   Sabra Heck, BRIAN [[id number]]   valACYclovir (VALTREX) 1000 MG tablet    Sig: Take 1 tablet (1,000 mg total) by mouth 3 (three) times daily for 7 days.    Dispense:  21 tablet    Refill:  0    Order Specific Question:   Supervising Provider    Answer:   Sabra Heck, BRIAN [[id number]]     *If you need refills on other medications prior to your next appointment, please contact your pharmacy*  Follow-Up: Call back or seek an in-person evaluation if the symptoms worsen or if the condition fails to improve as anticipated.  Other Instructions Please keep skin clean and dry. You need to keep the rash covered when having to be around others until the blisters have scabbed over and you are no longer contagious.  Take the Valtrex and Gabapentin as directed. You can take OTC Tylenol and Ibuprofen, as well as using OTC lidocaine spray if needed.   If anything worsening or new symptoms develop, please  seek an in-person evaluation.  Also, as discussed, use OTC Peroxyl mouthwash.    If you have been instructed to have an in-person evaluation today at a local Urgent Care facility, please use the link below. It will take you to a list of all of our available [facility], including address, phone number and hours of operation. Please do not delay care.  [facility]  If you or a family member do not have a primary care provider, use the link below to schedule a visit and establish care. When you choose a  primary care physician or advanced practice provider, you gain a long-term partner in health. Find a Primary Care Provider  Learn more about 's in-office and virtual care options: [facility]

## 2022-04-01 NOTE — Progress Notes (Signed)
Virtual Visit Consent   Janice Huynh, you are scheduled for a virtual visit with a [facility] provider today. Just as with appointments in the office, your consent must be obtained to participate. Your consent will be active for this visit and any virtual visit you may have with one of our providers in the next 365 days. If you have a MyChart account, a copy of this consent can be sent to you electronically.  As this is a virtual visit, video technology does not allow for your provider to perform a traditional examination. This may limit your provider's ability to fully assess your condition. If your provider identifies any concerns that need to be evaluated in person or the need to arrange testing (such as labs, EKG, etc.), we will make arrangements to do so. Although advances in technology are sophisticated, we cannot ensure that it will always work on either your end or our end. If the connection with a video visit is poor, the visit may have to be switched to a telephone visit. With either a video or telephone visit, we are not always able to ensure that we have a secure connection.  By engaging in this virtual visit, you consent to the provision of healthcare and authorize for your insurance to be billed (if applicable) for the services provided during this visit. Depending on your insurance coverage, you may receive a charge related to this service.  I need to obtain your verbal consent now. Are you willing to proceed with your visit today? TISA WEISEL has provided verbal consent on 04/01/2022 for a virtual visit (video or telephone). [name], [place]  Date: 04/01/2022 1:52 PM  Virtual Visit via Video Note   I, [name], connected with  Janice Huynh  ([id number], [birth year]) on 04/01/22 at  1:45 PM EDT by a video-enabled telemedicine application and verified that I am speaking with the correct person using two identifiers.  Location: Patient: Virtual Visit  Location Patient: Home Provider: Virtual Visit Location Provider: Home Office   I discussed the limitations of evaluation and management by telemedicine and the availability of in person appointments. The patient expressed understanding and agreed to proceed.    History of Present Illness: Janice Huynh is a 21 y.o. who identifies as a female who was assigned female at birth, and is being seen today for painful blistering ras of L elbow and forearm over the past couple of days. Notes some burning of the skin in the area prior to rash onset. Denies any fever, chills. Denies change to soaps, lotions or detergents. Denies contact with any chemical. Denies rash noted elsewhere on her body.   HPI: HPI  Problems:  Patient Active Problem List   Diagnosis Date Noted   IDA (iron deficiency anemia) 05/19/2020   Postpartum care following vaginal delivery 7/17 05/18/2020   SVD (spontaneous vaginal delivery) 05/18/2020   Perineal laceration, second degree 05/18/2020   Hypokalemia 05/18/2020   Gestational (pregnancy-induced) hypertension without significant proteinuria, complicating childbirth 05/17/2020   Bilateral kidney stones 01/25/2020   Bacterial vaginosis 10/15/2018    Allergies: No Known Allergies Medications:  Current Outpatient Medications:    gabapentin (NEURONTIN) 100 MG capsule, Take 1 capsule (100 mg total) by mouth 2 (two) times daily., Disp: 30 capsule, Rfl: 0   valACYclovir (VALTREX) 1000 MG tablet, Take 1 tablet (1,000 mg total) by mouth 3 (three) times daily for 7 days., Disp: 21 tablet, Rfl: 0  Observations/Objective: Patient is well-developed, well-nourished  in no acute distress.  Resting comfortably at home.  Head is normocephalic, atraumatic.  No labored breathing. Speech is clear and coherent with logical content.  Patient is alert and oriented at baseline.  Papulovesicular rash of her L medial forearm and elbow following a dermatomal distribution.   Assessment and  Plan: 1. Herpes zoster without complication - gabapentin (NEURONTIN) 100 MG capsule; Take 1 capsule (100 mg total) by mouth 2 (two) times daily.  Dispense: 30 capsule; Refill: 0 - valACYclovir (VALTREX) 1000 MG tablet; Take 1 tablet (1,000 mg total) by mouth 3 (three) times daily for 7 days.  Dispense: 21 tablet; Refill: 0  Start Valtrex TID x 7 days. Start Gabapentin 100 mg BID for pain. Can use OTC Tylenol, Ibuprofen and topical OTC lidocaine spray. She is to keep covered until completely scabbed over. UC for any new or worsening symptoms despite treatment.   Follow Up Instructions: I discussed the assessment and treatment plan with the patient. The patient was provided an opportunity to ask questions and all were answered. The patient agreed with the plan and demonstrated an understanding of the instructions.  A copy of instructions were sent to the patient via MyChart unless otherwise noted below.   The patient was advised to call back or seek an in-person evaluation if the symptoms worsen or if the condition fails to improve as anticipated.  Time:  I spent 10 minutes with the patient via telehealth technology discussing the above problems/concerns.    [name], PA-C

## 2022-05-12 ENCOUNTER — Ambulatory Visit
Admission: EM | Admit: 2022-05-12 | Discharge: 2022-05-12 | Disposition: A | Discharge status: Home / Self Care | Payer: Self-pay | Attending: Internal Medicine | Admitting: Internal Medicine

## 2022-05-12 ENCOUNTER — Ambulatory Visit (INDEPENDENT_AMBULATORY_CARE_PROVIDER_SITE_OTHER): Payer: Self-pay

## 2022-05-12 DIAGNOSIS — Z113 Encounter for screening for infections with a predominantly sexual mode of transmission: Secondary | ICD-10-CM | POA: Insufficient documentation

## 2022-05-12 DIAGNOSIS — S01512A Laceration without foreign body of oral cavity, initial encounter: Secondary | ICD-10-CM | POA: Insufficient documentation

## 2022-05-12 DIAGNOSIS — M25561 Pain in right knee: Secondary | ICD-10-CM

## 2022-05-12 DIAGNOSIS — F17290 Nicotine dependence, other tobacco product, uncomplicated: Secondary | ICD-10-CM | POA: Insufficient documentation

## 2022-05-12 DIAGNOSIS — N939 Abnormal uterine and vaginal bleeding, unspecified: Secondary | ICD-10-CM | POA: Insufficient documentation

## 2022-05-12 LAB — POCT URINALYSIS DIP (MANUAL ENTRY)
Bilirubin, UA: NEGATIVE
Glucose, UA: NEGATIVE mg/dL
Leukocytes, UA: NEGATIVE
Nitrite, UA: NEGATIVE
Protein Ur, POC: 30 mg/dL — AB
Spec Grav, UA: >=1.03 — AB (ref 1.010–1.025)
Urobilinogen, UA: 2 E.U./dL — AB
pH, UA: 6.5 (ref 5.0–8.0)

## 2022-05-12 LAB — POCT URINE PREGNANCY: Preg Test, Ur: NEGATIVE

## 2022-05-12 MED ORDER — LIDOCAINE VISCOUS HCL 2 % MT SOLN: 15.0000 mL | OROMUCOSAL | 0 refills | Status: DC | PRN

## 2022-05-12 NOTE — Discharge Instructions (Addendum)
Your STD tests are pending.  We will call if they are abnormal and treat as appropriate.  Please refrain from sexual activity until test results and treatment are complete.  Urine pregnancy was negative.  Your urine test indicates that you need to increase your water intake.  Would recommend repeating your urine test in the next few weeks for recheck.  You have been prescribed lidocaine solution to apply to laceration in your mouth.  Please monitor for signs of infection that include increased redness, swelling, pus.  Follow-up if this occurs.  Your knee x-ray was normal.  Suspect that you have badly bruised or strained your knee.  Knee brace has been applied.  Also use ice application and take over-the-counter pain relievers as needed.  Please follow-up if any symptoms persist or worsen.
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
## 2022-05-12 NOTE — ED Triage Notes (Signed)
Pt presents with a painful lesion on the right inside area of her mouth since yesterday; pt also complains of right knee pain from an injury from yesterday, would not give details.

## 2022-05-13 LAB — RPR: RPR Ser Ql: NONREACTIVE

## 2022-05-13 LAB — HIV ANTIBODY (ROUTINE TESTING W REFLEX): HIV Screen 4th Generation wRfx: NONREACTIVE

## 2022-05-14 ENCOUNTER — Telehealth (HOSPITAL_COMMUNITY): Payer: Self-pay | Admitting: Emergency Medicine

## 2022-05-14 LAB — CERVICOVAGINAL ANCILLARY ONLY
Bacterial Vaginitis (gardnerella): POSITIVE — AB
Candida Glabrata: NEGATIVE
Candida Vaginitis: NEGATIVE
Chlamydia: NEGATIVE
Comment: NEGATIVE
Comment: NEGATIVE
Comment: NEGATIVE
Comment: NEGATIVE
Comment: NEGATIVE
Comment: NORMAL
Neisseria Gonorrhea: NEGATIVE
Trichomonas: NEGATIVE

## 2022-05-14 MED ORDER — METRONIDAZOLE 500 MG PO TABS: 500.0000 mg | ORAL_TABLET | Freq: Two times a day (BID) | ORAL | 0 refills | Status: DC

## 2022-07-12 ENCOUNTER — Ambulatory Visit
Admission: EM | Admit: 2022-07-12 | Discharge: 2022-07-12 | Disposition: A | Discharge status: Home / Self Care | Payer: Medicaid Other | Attending: Internal Medicine | Admitting: Internal Medicine

## 2022-07-12 ENCOUNTER — Encounter: Payer: Self-pay | Admitting: Emergency Medicine

## 2022-07-12 DIAGNOSIS — Z113 Encounter for screening for infections with a predominantly sexual mode of transmission: Secondary | ICD-10-CM | POA: Diagnosis present

## 2022-07-12 DIAGNOSIS — M25561 Pain in right knee: Secondary | ICD-10-CM

## 2022-07-12 MED ORDER — IBUPROFEN 600 MG PO TABS: 600.0000 mg | ORAL_TABLET | Freq: Four times a day (QID) | ORAL | 0 refills | Status: DC | PRN
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
## 2022-07-12 NOTE — Discharge Instructions (Addendum)
You have been prescribed ibuprofen to take as needed for your knee pain.  Please do not take any additional ibuprofen, Advil, Aleve while taking this medication.  Please also apply ice application a few times a day.  Please follow-up with orthopedist at provided contact information for further evaluation and management.  Your STD test is pending.  We will call if it is positive.  We ask that you refrain from sexual activity until test results and treatment are complete.

## 2022-07-12 NOTE — ED Triage Notes (Signed)
Pt is present today with right knee pain. Pt states that she is having recurrent knee pain from May. Pt states that she was given a brace the last time she was here but it has not helped. Pt states that she cant bend her  knee without feeling discomfort.    Pt states that she would also like to be tested for STD. Pt denies any sx

## 2022-07-13 LAB — CERVICOVAGINAL ANCILLARY ONLY
Bacterial Vaginitis (gardnerella): POSITIVE — AB
Candida Glabrata: NEGATIVE
Candida Vaginitis: NEGATIVE
Chlamydia: NEGATIVE
Comment: NEGATIVE
Comment: NEGATIVE
Comment: NEGATIVE
Comment: NEGATIVE
Comment: NEGATIVE
Comment: NORMAL
Neisseria Gonorrhea: NEGATIVE
Trichomonas: NEGATIVE

## 2022-07-14 ENCOUNTER — Telehealth (HOSPITAL_COMMUNITY): Payer: Self-pay | Admitting: Emergency Medicine

## 2022-07-14 MED ORDER — METRONIDAZOLE 500 MG PO TABS: 500.0000 mg | ORAL_TABLET | Freq: Two times a day (BID) | ORAL | 0 refills | Status: DC

## 2022-10-05 ENCOUNTER — Ambulatory Visit
Admission: EM | Admit: 2022-10-05 | Discharge: 2022-10-05 | Disposition: A | Discharge status: Home / Self Care | Payer: Medicaid Other | Attending: Physician Assistant | Admitting: Physician Assistant

## 2022-10-05 DIAGNOSIS — L089 Local infection of the skin and subcutaneous tissue, unspecified: Secondary | ICD-10-CM | POA: Diagnosis present

## 2022-10-05 MED ORDER — MUPIROCIN 2 % EX OINT: 1.0000 | TOPICAL_OINTMENT | Freq: Two times a day (BID) | CUTANEOUS | 0 refills | Status: DC

## 2022-10-05 NOTE — ED Triage Notes (Signed)
Pt c/o a cluster of vesicular lesions on a erythremic base that is pruritic noted saturday to LUQ about 3-4 inches away from 2 o'clock from the umbilical.

## 2022-10-05 NOTE — Discharge Instructions (Signed)
Apply topical antibiotic to skin

## 2022-10-07 LAB — CERVICOVAGINAL ANCILLARY ONLY
Bacterial Vaginitis (gardnerella): POSITIVE — AB
Candida Glabrata: NEGATIVE
Candida Vaginitis: NEGATIVE
Chlamydia: NEGATIVE
Comment: NEGATIVE
Comment: NEGATIVE
Comment: NEGATIVE
Comment: NEGATIVE
Comment: NEGATIVE
Comment: NORMAL
Neisseria Gonorrhea: NEGATIVE
Trichomonas: NEGATIVE

## 2022-10-08 ENCOUNTER — Telehealth (HOSPITAL_COMMUNITY): Payer: Self-pay | Admitting: Emergency Medicine

## 2022-10-08 MED ORDER — METRONIDAZOLE 500 MG PO TABS: 500.0000 mg | ORAL_TABLET | Freq: Two times a day (BID) | ORAL | 0 refills | Status: DC
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
## 2023-01-28 ENCOUNTER — Ambulatory Visit
Admission: EM | Admit: 2023-01-28 | Discharge: 2023-01-28 | Disposition: A | Discharge status: Home / Self Care | Payer: Medicaid Other | Attending: Physician Assistant | Admitting: Physician Assistant

## 2023-01-28 DIAGNOSIS — Z113 Encounter for screening for infections with a predominantly sexual mode of transmission: Secondary | ICD-10-CM | POA: Insufficient documentation

## 2023-01-28 DIAGNOSIS — N760 Acute vaginitis: Secondary | ICD-10-CM | POA: Diagnosis not present

## 2023-01-28 LAB — POCT URINE PREGNANCY: Preg Test, Ur: NEGATIVE

## 2023-01-28 MED ORDER — METRONIDAZOLE 500 MG PO TABS: 500.0000 mg | ORAL_TABLET | Freq: Two times a day (BID) | ORAL | 0 refills | Status: DC

## 2023-01-28 NOTE — ED Triage Notes (Addendum)
Pt presents to uc with co of increase in vaginal discharge for 3 days concerned for bv . Pt reports she is on the implant bc and lmp was in [place].

## 2023-01-28 NOTE — ED Provider Notes (Signed)
[name]    CSN: [id number] Arrival date & time: 01/28/23  1008      History   Chief Complaint Chief Complaint  Patient presents with   sti check     HPI Janice Huynh is a 22 y.o. female.   22 year old female presents with vaginal discharge.  Patient indicates she desires STI testing excluding HIV and RPR.  Patient indicates for the past 3 days she has been having some vaginal discharge which she describes as white, thick, occasionally with odor.  She indicates she is not having any external or internal vaginal itching.  She does relate that her last episode of intercourse was 2 days ago and it was unprotected.  Patient relates she does have the Implanon and that her last period was in January.  Patient is without fever, chills, nausea or vomiting.  Patient indicates she is not having any urinary symptoms.  Patient indicates she has recurrent episodes of BV, and indicates that this may be because she never finishes her antibiotic therapy.  Patient indicates she believes that this is also BV, she has been counseled to ensure that she finishes her medicine and this will help prevent the infection from recurring.     Past Medical History:  Diagnosis Date   Medical history non-contributory     Patient Active Problem List   Diagnosis Date Noted   IDA (iron deficiency anemia) 05/19/2020   Postpartum care following vaginal delivery 7/17 05/18/2020   SVD (spontaneous vaginal delivery) 05/18/2020   Perineal laceration, second degree 05/18/2020   Hypokalemia 05/18/2020   Gestational (pregnancy-induced) hypertension without significant proteinuria, complicating childbirth [id number]   Bilateral kidney stones 01/25/2020   Bacterial vaginosis 10/15/2018    Past Surgical History:  Procedure Laterality Date   NO PAST SURGERIES      OB History     Gravida  2   Para  1   Term  1   Preterm      AB  1   Living  1      SAB  1   IAB      Ectopic       Multiple  0   Live Births  1            Home Medications    Prior to Admission medications   Medication Sig Start Date End Date Taking? Authorizing Provider  gabapentin (NEURONTIN) 100 MG capsule Take 1 capsule (100 mg total) by mouth 2 (two) times daily. 04/01/22   Brunetta Jeans, PA-C  ibuprofen (ADVIL) 600 MG tablet Take 1 tablet (600 mg total) by mouth every 6 (six) hours as needed for mild pain. 07/12/22   Teodora Medici, FNP  lidocaine (XYLOCAINE) 2 % solution Use as directed 15 mLs in the mouth or throat as needed for mouth pain. 05/12/22   Teodora Medici, FNP  metroNIDAZOLE (FLAGYL) 500 MG tablet Take 1 tablet (500 mg total) by mouth 2 (two) times daily. 01/28/23   Nyoka Lint, PA-C  mupirocin ointment (BACTROBAN) 2 % Apply 1 Application topically 2 (two) times daily for 7 days. 10/05/22 10/12/22  Fransico Meadow, PA-C    Family History Family History  Problem Relation Age of Onset   Hypertension Mother    Multiple sclerosis Mother     Social History Social History   Tobacco Use   Smoking status: Never   Smokeless tobacco: Never  Vaping Use   Vaping Use: Some days   Substances: Nicotine,  Flavoring  Substance Use Topics   Alcohol use: Yes   Drug use: Never     Allergies   Patient has no known allergies.   Review of Systems Review of Systems  Genitourinary:  Positive for vaginal discharge (white and thick without itching).     Physical Exam Triage Vital Signs ED Triage Vitals  Enc Vitals Group     BP 01/28/23 1112 95/61     Pulse Rate 01/28/23 1112 (!) 56     Resp 01/28/23 1112 20     Temp 01/28/23 1112 97.9 F (36.6 C)     Temp src --      SpO2 01/28/23 1112 98 %     Weight 01/28/23 1109 140 lb (63.5 kg)     Height --      Head Circumference --      Peak Flow --      Pain Score 01/28/23 1109 0     Pain Loc --      Pain Edu? --      Excl. in [place]? --    No data found.  Updated Vital Signs BP 95/61   Pulse (!) 56   Temp 97.9 F (36.6 C)    Resp 20   Wt 140 lb (63.5 kg)   LMP 12/02/2022   SpO2 98%   BMI 25.61 kg/m   Visual Acuity Right Eye Distance:   Left Eye Distance:   Bilateral Distance:    Right Eye Near:   Left Eye Near:    Bilateral Near:     Physical Exam Constitutional:      Appearance: Normal appearance.  Neurological:     Mental Status: She is alert.      UC Treatments / Results  Labs (all labs ordered are listed, but only abnormal results are displayed) Labs Reviewed  POCT URINE PREGNANCY  CERVICOVAGINAL ANCILLARY ONLY    EKG   Radiology No results found.  Procedures Procedures (including critical care time)  Medications Ordered in UC Medications - No data to display  Initial Impression / Assessment and Plan / UC Course  I have reviewed the triage vital signs and the nursing notes.  Pertinent labs & imaging results that were available during my care of the patient were reviewed by me and considered in my medical decision making (see chart for details).    Plan: The diagnosis be treated with the following: 1.  Screening for STI: A.  Treatment may be modified depending on results of STI testing. 2.  Vaginitis: A.  Flagyl 500 mg every 12 hours to treat BV. 3.  Patient advised follow-up PCP return to urgent care as needed. Final Clinical Impressions(s) / UC Diagnoses   Final diagnoses:  Routine screening for STI (sexually transmitted infection)  Vaginitis and vulvovaginitis     Discharge Instructions      Lab testing will be completed in 48 hours.  If you do not get a call from this office that indicates the test is negative.  Log onto MyChart to be the test results with post in 48 hours.  Advised take the medicine as directed and complete the course of therapy.  Advised follow-up PCP return to urgent care as needed.    ED Prescriptions     Medication Sig Dispense Auth. Provider   metroNIDAZOLE (FLAGYL) 500 MG tablet Take 1 tablet (500 mg total) by mouth 2 (two)  times daily. 14 tablet Nyoka Lint, PA-C      PDMP not reviewed this  encounter.   Nyoka Lint, PA-C 01/28/23 1136

## 2023-01-28 NOTE — Discharge Instructions (Signed)
Lab testing will be completed in 48 hours.  If you do not get a call from this office that indicates the test is negative.  Log onto MyChart to be the test results with post in 48 hours.  Advised take the medicine as directed and complete the course of therapy.  Advised follow-up PCP return to urgent care as needed.

## 2023-01-29 LAB — CERVICOVAGINAL ANCILLARY ONLY
Bacterial Vaginitis (gardnerella): POSITIVE — AB
Candida Glabrata: NEGATIVE
Candida Vaginitis: NEGATIVE
Chlamydia: NEGATIVE
Comment: NEGATIVE
Comment: NEGATIVE
Comment: NEGATIVE
Comment: NEGATIVE
Comment: NEGATIVE
Comment: NORMAL
Neisseria Gonorrhea: NEGATIVE
Trichomonas: POSITIVE — AB

## 2023-02-01 ENCOUNTER — Telehealth (HOSPITAL_COMMUNITY): Payer: Self-pay | Admitting: Emergency Medicine

## 2023-02-01 NOTE — Telephone Encounter (Signed)
Opened in error

## 2023-03-13 IMAGING — CT CT ABD-PELV W/ CM
2 of 4 series · 16 of 46 positions shown, 18 images · IV contrast (agent unspecified)
Comparison: None.

CLINICAL DATA: Abdominal pain.

EXAM:
CT ABDOMEN AND PELVIS WITH CONTRAST
TECHNIQUE: Multidetector CT imaging of the abdomen and pelvis was performed
using the standard protocol following bolus administration of
intravenous contrast.

[Series 2: axial st · axial · 0.86mm/px · z∈[-459,-79]mm · 13 of 86 slices shown, 15 images]
[im 5/86  soft-tissue]
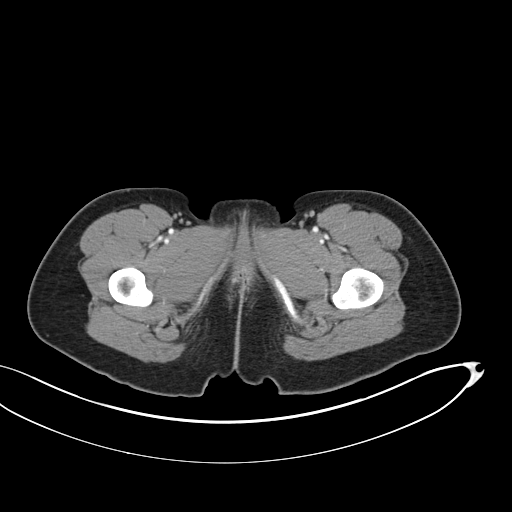
[im 5/86  bone]
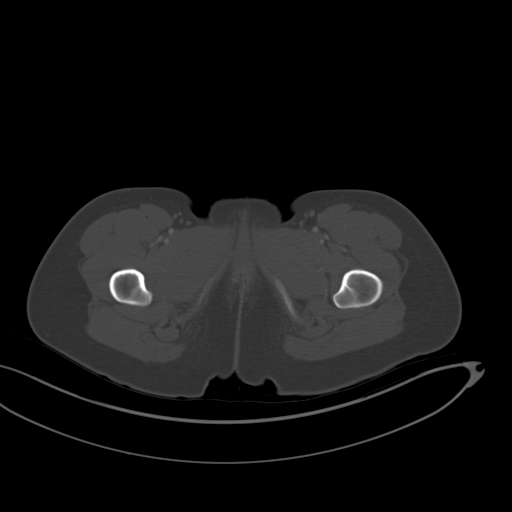
[im 13/86  soft-tissue]
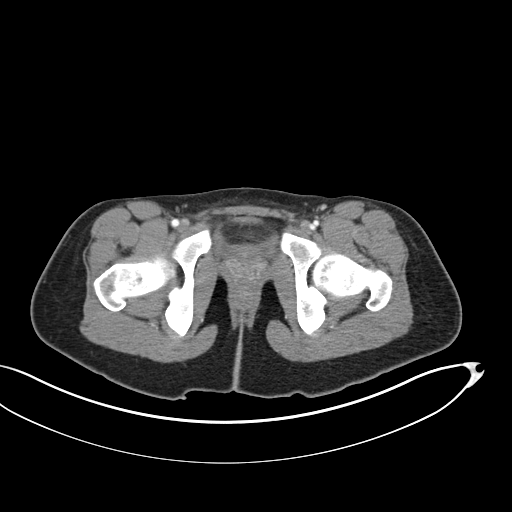
[im 17/86  soft-tissue]
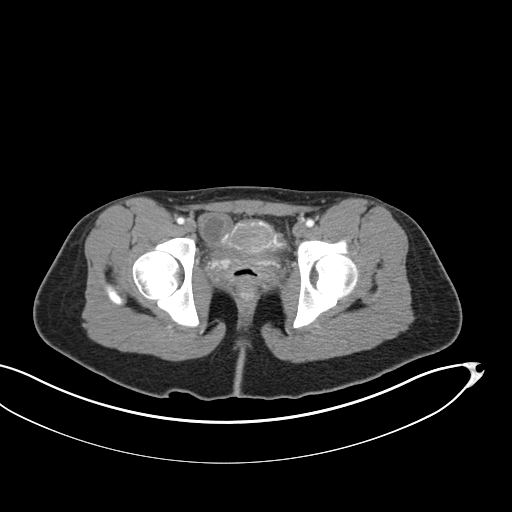
[im 25/86  soft-tissue]
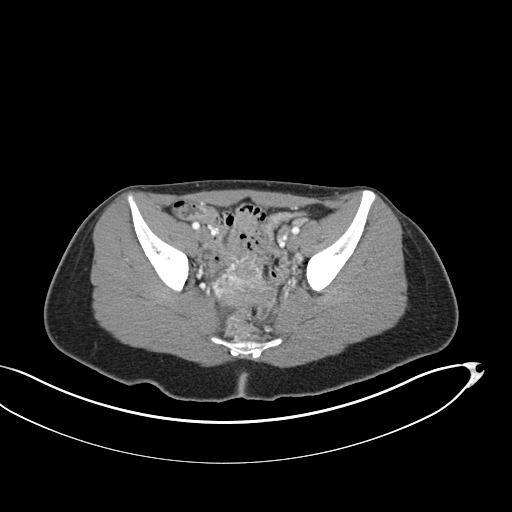
[im 29/86  soft-tissue]
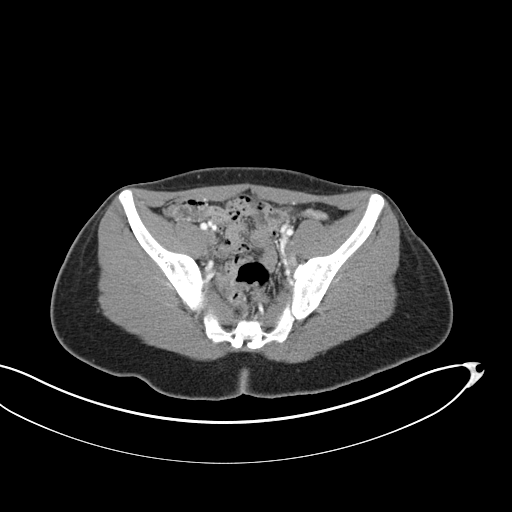
[im 37/86  soft-tissue]
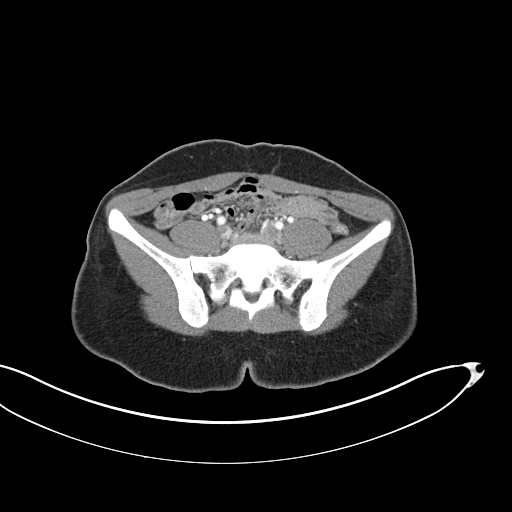
[im 45/86  soft-tissue]
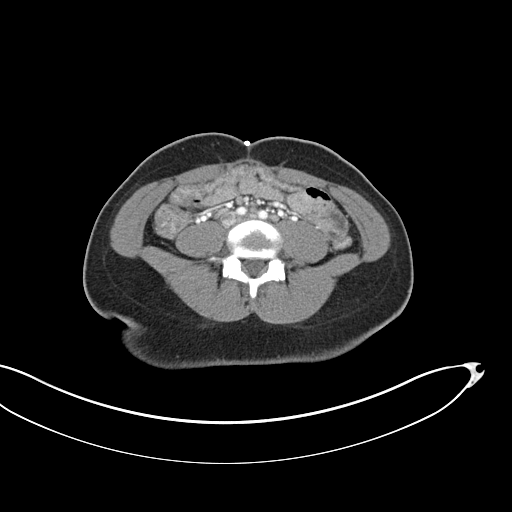
[im 49/86  soft-tissue]
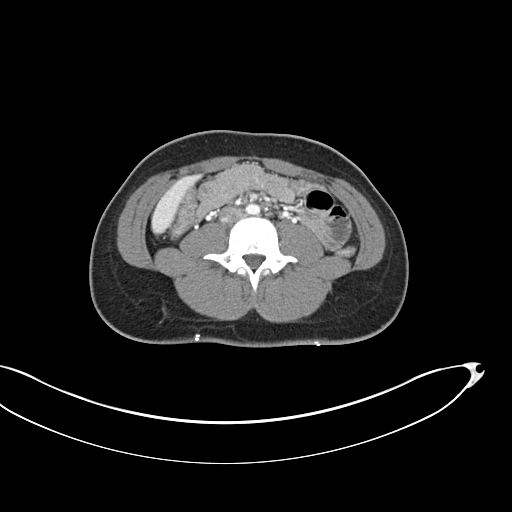
[im 57/86  soft-tissue]
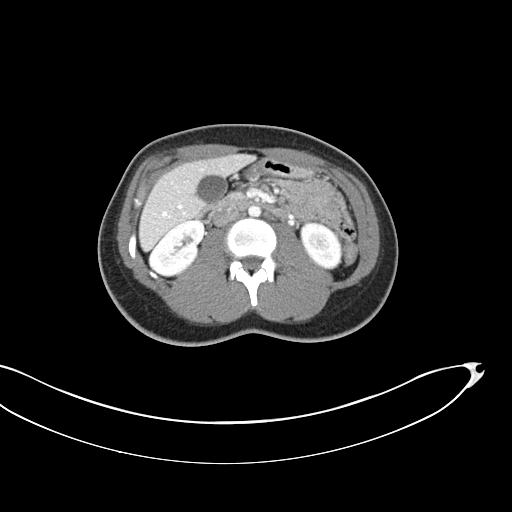
[im 57/86  bone]
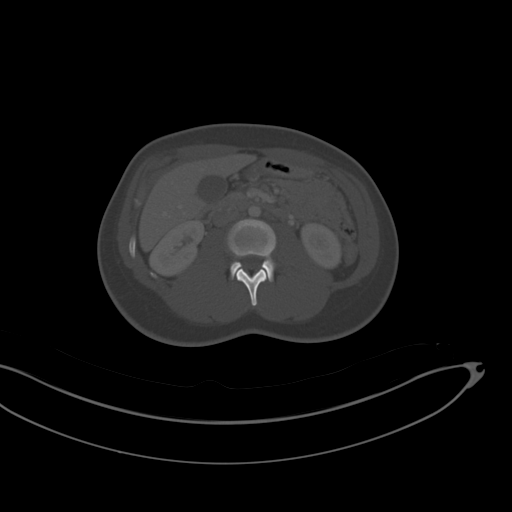
[im 61/86  soft-tissue]
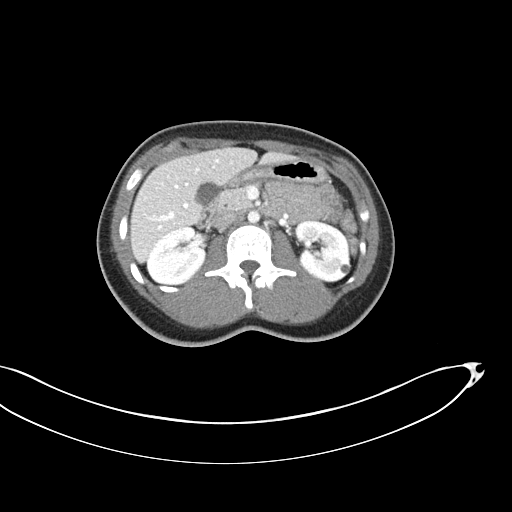
[im 69/86  soft-tissue]
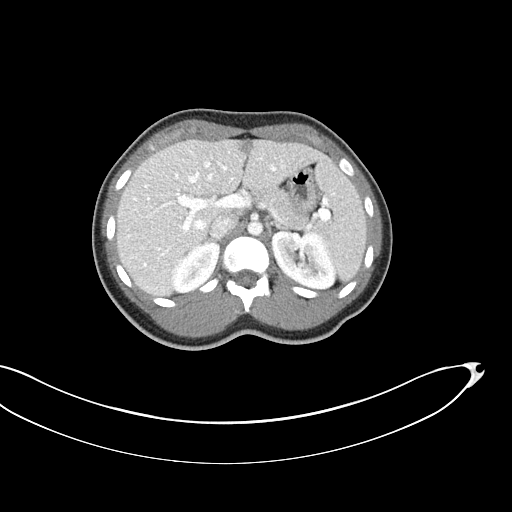
[im 73/86  soft-tissue]
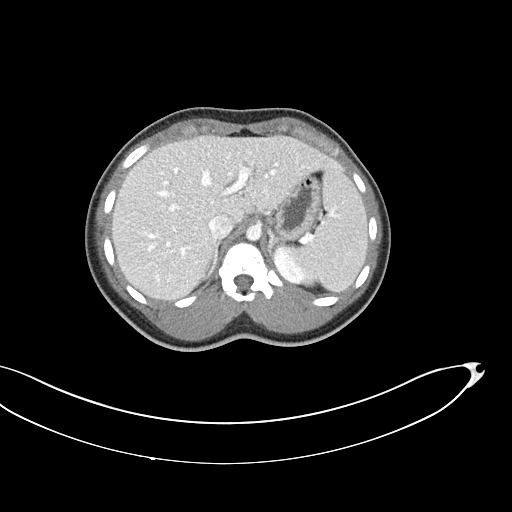
[im 81/86  soft-tissue]
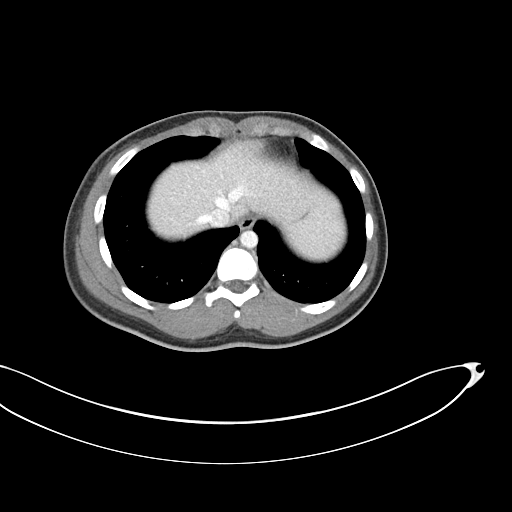

[Series 4: coronal st · coronal · 0.73mm/px · 3 of 136 slices shown]
[im 46/136  soft-tissue]
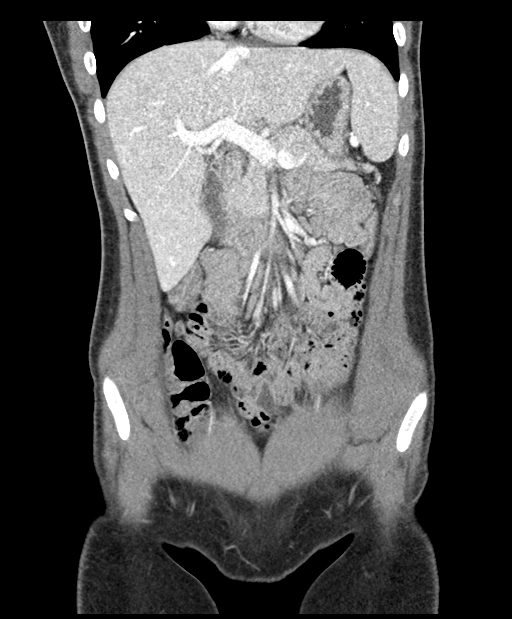
[im 61/136  soft-tissue]
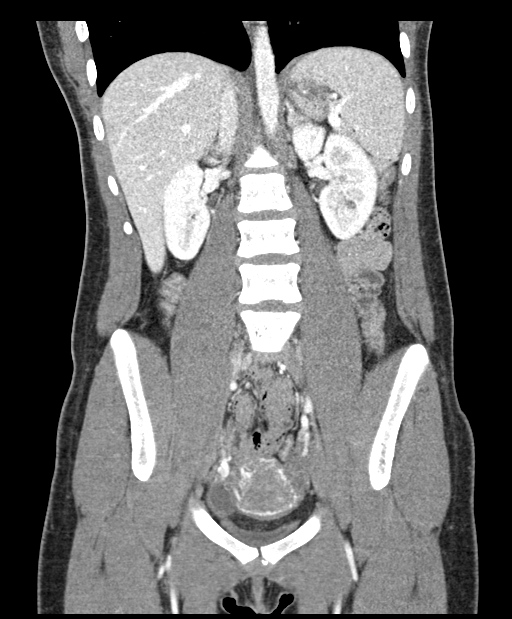
[im 76/136  soft-tissue]
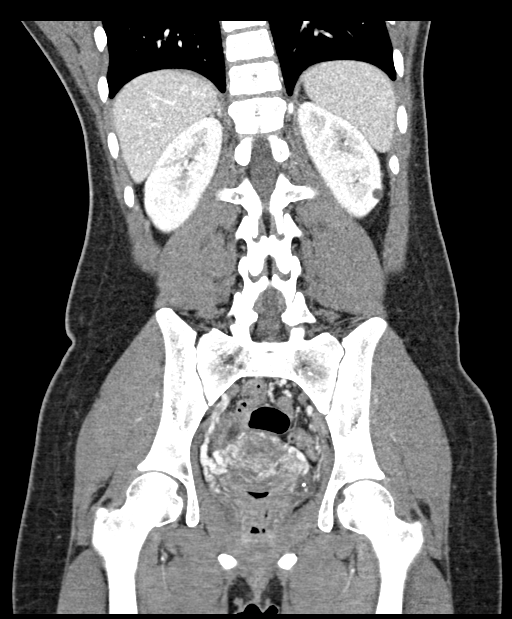

[16 of 46 positions shown; findings below may reference images not displayed]

RADIATION DOSE REDUCTION: This exam was performed according to the
departmental dose-optimization program which includes automated
exposure control, adjustment of the mA and/or kV according to
patient size and/or use of iterative reconstruction technique.

CONTRAST:  100mL OMNIPAQUE IOHEXOL 300 MG/ML  SOLN
FINDINGS: Lower chest: Negative.

Hepatobiliary: No focal liver abnormality is seen. Small amount of
focal fat along the falciform ligament. No gallstones, gallbladder
wall thickening, or biliary dilatation.

Pancreas: Unremarkable. No pancreatic ductal dilatation or
surrounding inflammatory changes.

Spleen: Normal in size without focal abnormality.

Adrenals/Urinary Tract: Adrenal glands are unremarkable.
Subcentimeter low-density lesion in the left kidney, too small to
characterize, though likely a cyst. No renal calculus or
hydronephrosis. The bladder is decompressed.

Stomach/Bowel: Stomach is within normal limits. Appendix appears
normal. No evidence of bowel wall thickening, distention, or
inflammatory changes.

Vascular/Lymphatic: No significant vascular findings are present. No
enlarged abdominal or pelvic lymph nodes.

Reproductive: The uterus and left ovary are unremarkable. 2.2 cm
simple appearing cyst in the right ovary.

Other: Trace free fluid in the pelvis, likely physiologic. No
pneumoperitoneum.

Musculoskeletal: No acute or significant osseous findings.
IMPRESSION: 1. No acute intra-abdominal process.
2. 2.2 cm simple appearing cyst in the right ovary. No follow-up
imaging recommended. Note: This recommendation does not apply to
premenarchal patients and to those with increased risk (genetic,
family history, elevated tumor markers or other high-risk factors)
of ovarian cancer. Reference: JACR [DATE]):[id number]

## 2023-04-27 ENCOUNTER — Telehealth: Payer: Medicaid Other | Admitting: Physician Assistant

## 2023-04-27 DIAGNOSIS — R3989 Other symptoms and signs involving the genitourinary system: Secondary | ICD-10-CM | POA: Diagnosis not present

## 2023-04-27 MED ORDER — CEPHALEXIN 500 MG PO CAPS: 500.0000 mg | ORAL_CAPSULE | Freq: Two times a day (BID) | ORAL | 0 refills | Status: AC

## 2023-04-27 NOTE — Patient Instructions (Signed)
Janice Huynh, thank you for joining [name], PA-C for today's virtual visit.  While this provider is not your primary care provider (PCP), if your PCP is located in our provider database this encounter information will be shared with them immediately following your visit.   A [facility] MyChart account gives you access to today's visit and all your visits, tests, and labs performed at [facility] " click here if you don't have a [facility] MyChart account or go to [email or web address]  Consent: (Patient) Janice Huynh provided verbal consent for this virtual visit at the beginning of the encounter.  Current Medications:  Current Outpatient Medications:    cephALEXin (KEFLEX) 500 MG capsule, Take 1 capsule (500 mg total) by mouth 2 (two) times daily for 7 days., Disp: 14 capsule, Rfl: 0   Medications ordered in this encounter:  Meds ordered this encounter  Medications   cephALEXin (KEFLEX) 500 MG capsule    Sig: Take 1 capsule (500 mg total) by mouth 2 (two) times daily for 7 days.    Dispense:  14 capsule    Refill:  0    Order Specific Question:   Supervising Provider    Answer:   Merrilee Jansky [id number]     *If you need refills on other medications prior to your next appointment, please contact your pharmacy*  Follow-Up: Call back or seek an in-person evaluation if the symptoms worsen or if the condition fails to improve as anticipated.  [facility] [phone]  Other Instructions Your symptoms are consistent with a bladder infection, also called acute cystitis. Please take your antibiotic (Keflex) as directed until all pills are gone.  Stay very well hydrated.  Consider a daily probiotic (Align, Culturelle, or Activia) to help prevent stomach upset caused by the antibiotic.  Taking a probiotic daily may also help prevent recurrent UTIs.  Also consider taking AZO (Phenazopyridine) tablets to help decrease pain with  urination.     Urinary Tract Infection A urinary tract infection (UTI) can occur any place along the urinary tract. The tract includes the kidneys, ureters, bladder, and urethra. A type of germ called bacteria often causes a UTI. UTIs are often helped with antibiotic medicine.  HOME CARE  If given, take antibiotics as told by your doctor. Finish them even if you start to feel better. Drink enough fluids to keep your pee (urine) clear or pale yellow. Avoid tea, drinks with caffeine, and bubbly (carbonated) drinks. Pee often. Avoid holding your pee in for a long time. Pee before and after having sex (intercourse). Wipe from front to back after you poop (bowel movement) if you are a woman. Use each tissue only once. GET HELP RIGHT AWAY IF:  You have back pain. You have lower belly (abdominal) pain. You have chills. You feel sick to your stomach (nauseous). You throw up (vomit). Your burning or discomfort with peeing does not go away. You have a fever. Your symptoms are not better in 3 days. MAKE SURE YOU:  Understand these instructions. Will watch your condition. Will get help right away if you are not doing well or get worse. Document Released: 04/06/2008 Document Revised: 07/13/2012 Document Reviewed: 05/19/2012 [facility] Patient Information 2015 [place], [place]. This information is not intended to replace advice given to you by your health care provider. Make sure you discuss any questions you have with your health care provider.    If you have been instructed to have an in-person evaluation today  at a local Urgent Care facility, please use the link below. It will take you to a list of all of our available [facility], including address, phone number and hours of operation. Please do not delay care.  [facility]  If you or a family member do not have a primary care provider, use the link below to schedule a visit and establish care. When you choose a [facility] primary care physician or advanced practice provider, you gain a long-term partner in health. Find a Primary Care Provider  Learn more about [facility]'s in-office and virtual care options: [facility] - Get Care Now

## 2023-04-27 NOTE — Progress Notes (Signed)
Virtual Visit Consent   Janice Huynh, you are scheduled for a virtual visit with a [facility] provider today. Just as with appointments in the office, your consent must be obtained to participate. Your consent will be active for this visit and any virtual visit you may have with one of our providers in the next 365 days. If you have a MyChart account, a copy of this consent can be sent to you electronically.  As this is a virtual visit, video technology does not allow for your provider to perform a traditional examination. This may limit your provider's ability to fully assess your condition. If your provider identifies any concerns that need to be evaluated in person or the need to arrange testing (such as labs, EKG, etc.), we will make arrangements to do so. Although advances in technology are sophisticated, we cannot ensure that it will always work on either your end or our end. If the connection with a video visit is poor, the visit may have to be switched to a telephone visit. With either a video or telephone visit, we are not always able to ensure that we have a secure connection.  By engaging in this virtual visit, you consent to the provision of healthcare and authorize for your insurance to be billed (if applicable) for the services provided during this visit. Depending on your insurance coverage, you may receive a charge related to this service.  I need to obtain your verbal consent now. Are you willing to proceed with your visit today? COPELYN WIDMER has provided verbal consent on 04/27/2023 for a virtual visit (video or telephone). Janice Huynh, [place]  Date: 04/27/2023 2:30 PM  Virtual Visit via Video Note   I, Janice Huynh, connected with  TAMECIA MCDOUGALD  ([id number], 2001/07/08) on 04/27/23 at  2:30 PM EDT by a video-enabled telemedicine application and verified that I am speaking with the correct person using two identifiers.  Location: Patient: Virtual Visit  Location Patient: Home Provider: Virtual Visit Location Provider: Home Office   I discussed the limitations of evaluation and management by telemedicine and the availability of in person appointments. The patient expressed understanding and agreed to proceed.    History of Present Illness: Janice Huynh is a 22 y.o. who identifies as a female who was assigned female at birth, and is being seen today for possible UTI. Endorses symptoms starting yesterday with dysuria, urgency, frequency, hesitancy. Denies fever, chills. Denies new onset back pain -- some ongoing issues. Denies belly pain. Nexplanon in place.   HPI: HPI  Problems:  Patient Active Problem List   Diagnosis Date Noted   IDA (iron deficiency anemia) 05/19/2020   Postpartum care following vaginal delivery 7/17 05/18/2020   SVD (spontaneous vaginal delivery) 05/18/2020   Perineal laceration, second degree 05/18/2020   Hypokalemia 05/18/2020   Gestational (pregnancy-induced) hypertension without significant proteinuria, complicating childbirth 05/17/2020   Bilateral kidney stones 01/25/2020   Bacterial vaginosis 10/15/2018    Allergies: No Known Allergies Medications:  Current Outpatient Medications:    cephALEXin (KEFLEX) 500 MG capsule, Take 1 capsule (500 mg total) by mouth 2 (two) times daily for 7 days., Disp: 14 capsule, Rfl: 0  Observations/Objective: Patient is well-developed, well-nourished in no acute distress.  Resting comfortably at home.  Head is normocephalic, atraumatic.  No labored breathing. Speech is clear and coherent with logical content.  Patient is alert and oriented at baseline.   Assessment and Plan: 1. Suspected UTI - cephALEXin (KEFLEX)  500 MG capsule; Take 1 capsule (500 mg total) by mouth 2 (two) times daily for 7 days.  Dispense: 14 capsule; Refill: 0  Classic UTI symptoms with absence of alarm signs or symptoms. Prior history of UTI. Will treat empirically with Keflex for suspected  uncomplicated cystitis. Supportive measures and OTC medications reviewed. Strict in-person evaluation precautions discussed.    Follow Up Instructions: I discussed the assessment and treatment plan with the patient. The patient was provided an opportunity to ask questions and all were answered. The patient agreed with the plan and demonstrated an understanding of the instructions.  A copy of instructions were sent to the patient via MyChart unless otherwise noted below.   The patient was advised to call back or seek an in-person evaluation if the symptoms worsen or if the condition fails to improve as anticipated.  Time:  I spent 10 minutes with the patient via telehealth technology discussing the above problems/concerns.    Janice Climes, PA-C

## 2023-08-18 ENCOUNTER — Telehealth: Payer: Medicaid Other | Admitting: Physician Assistant

## 2023-08-18 DIAGNOSIS — B029 Zoster without complications: Secondary | ICD-10-CM | POA: Diagnosis not present

## 2023-08-18 MED ORDER — VALACYCLOVIR HCL 1 G PO TABS: 1000.0000 mg | ORAL_TABLET | Freq: Three times a day (TID) | ORAL | 0 refills | Status: AC

## 2023-08-18 NOTE — Progress Notes (Signed)
Virtual Visit Consent   Janice Huynh, you are scheduled for a virtual visit with a [facility] provider today. Just as with appointments in the office, your consent must be obtained to participate. Your consent will be active for this visit and any virtual visit you may have with one of our providers in the next 365 days. If you have a MyChart account, a copy of this consent can be sent to you electronically.  As this is a virtual visit, video technology does not allow for your provider to perform a traditional examination. This may limit your provider's ability to fully assess your condition. If your provider identifies any concerns that need to be evaluated in person or the need to arrange testing (such as labs, EKG, etc.), we will make arrangements to do so. Although advances in technology are sophisticated, we cannot ensure that it will always work on either your end or our end. If the connection with a video visit is poor, the visit may have to be switched to a telephone visit. With either a video or telephone visit, we are not always able to ensure that we have a secure connection.  By engaging in this virtual visit, you consent to the provision of healthcare and authorize for your insurance to be billed (if applicable) for the services provided during this visit. Depending on your insurance coverage, you may receive a charge related to this service.  I need to obtain your verbal consent now. Are you willing to proceed with your visit today? [name] has provided verbal consent on 08/18/2023 for a virtual visit (video or telephone). Janice Huynh, [place]  Date: 08/18/2023 10:05 AM  Virtual Visit via Video Note   I, Janice Huynh, connected with  AALIJAH MIMS  ([id number], February 11, 2001) on 08/18/23 at  9:45 AM EDT by a video-enabled telemedicine application and verified that I am speaking with the correct person using two identifiers.  Location: Patient: Virtual Visit  Location Patient: Home Provider: Virtual Visit Location Provider: Home Office   I discussed the limitations of evaluation and management by telemedicine and the availability of in person appointments. The patient expressed understanding and agreed to proceed.    History of Present Illness: Janice Huynh is a 22 y.o. who identifies as a female who was assigned female at birth, and is being seen today for rash.of her L elbow starting this morning. Notes itching and burning sensation along with blistering. Denies fever, chills. Denies rash noted elsewhere. Has history of rash in same area, diagnosed as shingles. Marland Kitchen  HPI: HPI  Problems:  Patient Active Problem List   Diagnosis Date Noted   IDA (iron deficiency anemia) 05/19/2020   Postpartum care following vaginal delivery 7/17 05/18/2020   SVD (spontaneous vaginal delivery) 05/18/2020   Perineal laceration, second degree 05/18/2020   Hypokalemia 05/18/2020   Gestational (pregnancy-induced) hypertension without significant proteinuria, complicating childbirth 05/17/2020   Bilateral kidney stones 01/25/2020   Bacterial vaginosis 10/15/2018    Allergies: No Known Allergies Medications:  Current Outpatient Medications:    valACYclovir (VALTREX) 1000 MG tablet, Take 1 tablet (1,000 mg total) by mouth 3 (three) times daily for 7 days., Disp: 21 tablet, Rfl: 0  Observations/Objective: Patient is well-developed, well-nourished in no acute distress.  Resting comfortably at home.  Head is normocephalic, atraumatic.  No labored breathing. Speech is clear and coherent with logical content.  Patient is alert and oriented at baseline.  Vesicular rash over the surface of her  L elbow, extending a long a dermatomal distribution. No rash noted elsewhere.  Assessment and Plan: 1. Herpes zoster without complication - valACYclovir (VALTREX) 1000 MG tablet; Take 1 tablet (1,000 mg total) by mouth 3 (three) times daily for 7 days.  Dispense: 21 tablet;  Refill: 0  Valtrex per orders. Supportive measures and OTC medications reviewed. Giving young age and she has had flare in this area a few times in the past, needs PCP follow-up. Resources given.  Follow Up Instructions: I discussed the assessment and treatment plan with the patient. The patient was provided an opportunity to ask questions and all were answered. The patient agreed with the plan and demonstrated an understanding of the instructions.  A copy of instructions were sent to the patient via MyChart unless otherwise noted below.    The patient was advised to call back or seek an in-person evaluation if the symptoms worsen or if the condition fails to improve as anticipated.    Janice Climes, PA-C

## 2023-08-18 NOTE — Patient Instructions (Signed)
Janice Huynh, thank you for joining [name], PA-C for today's virtual visit.  While this provider is not your primary care provider (PCP), if your PCP is located in our provider database this encounter information will be shared with them immediately following your visit.   A [facility] MyChart account gives you access to today's visit and all your visits, tests, and labs performed at [facility] " click here if you don't have a [facility] MyChart account or go to [email or web address]  Consent: (Patient) Janice Huynh provided verbal consent for this virtual visit at the beginning of the encounter.  Current Medications: No current outpatient medications on file.   Medications ordered in this encounter:  No orders of the defined types were placed in this encounter.    *If you need refills on other medications prior to your next appointment, please contact your pharmacy*  Follow-Up: Call back or seek an in-person evaluation if the symptoms worsen or if the condition fails to improve as anticipated.   Virtual Care [phone]  Other Instructions Shingles  Shingles is an infection. It gives you a painful skin rash and blisters that have fluid in them. Shingles is caused by the same germ (virus) that causes chickenpox. Shingles only happens in people who: Have had chickenpox. Have been given a shot (vaccine) to protect against chickenpox. Shingles is rare in this group. What are the causes? This condition is caused by varicella-zoster virus. This is the same germ that causes chickenpox. After a person is exposed to the germ, the germ stays in the body but is not active (dormant). Shingles develops if the germ becomes active again (is reactivated). This can happen many years after the first exposure to the germ. It is not known what causes this germ to become active again. What increases the risk? People who have had chickenpox or  received the chickenpox shot are at risk for shingles. This infection is more common in people who: Are older than 20 years of age. Have a weakened disease-fighting system (immune system), such as people with: HIV (human immunodeficiency virus). AIDS (acquired immunodeficiency syndrome). Cancer. Are taking medicines that weaken the immune system, such as organ transplant medicines. Have a lot of stress. What are the signs or symptoms? The first symptoms of shingles may be itching, tingling, or pain in an area on your skin. A rash will show on your skin a few days or weeks later. This is what usually happens: The rash is likely to be on one side of your body. The rash usually has a shape like a belt or a band. Over time, the rash turns into fluid-filled blisters. The blisters will break open and change into scabs. The scabs usually dry up in about 2-3 weeks. You may also have: A fever. Chills. A headache. A feeling like you may vomit (nausea). How is this treated? The rash may last for several weeks. There is not a specific cure for this condition. Your doctor may prescribe medicines. Medicines may: Help with pain. Help you get better sooner. Help to prevent long-term problems. Help with itching (antihistamines). If the area involved is on your face, you may need to see a specialist. This may be an eye doctor or an ear, nose, and throat (ENT) doctor. Follow these instructions at home: Medicines Take over-the-counter and prescription medicines only as told by your doctor. Put on an anti-itch cream or numbing cream where you have a rash, blisters, or scabs. Do  this as told by your doctor. Helping with itching and discomfort  Put cold, wet cloths (cold compresses) on the area of the rash or blisters as told by your doctor. Cool baths can help you feel better. Try adding baking soda or dry oatmeal to the water to lessen itching. Do not bathe in hot water. Use calamine lotion as told by  your doctor. Blister and rash care Keep your rash covered with a loose bandage (dressing). Wear loose clothing that does not rub on your rash. Wash your hands with soap and water for at least 20 seconds before and after you change your bandage. If you cannot use soap and water, use hand sanitizer. Change your bandage as told by your doctor. Keep your rash and blisters clean. To do this, wash the area with mild soap and cool water as told by your doctor. Check your rash every day for signs of infection. Check for: More redness, swelling, or pain. Fluid or blood. Warmth. Pus or a bad smell. Do not scratch your rash. Do not pick at your blisters. To help you to not scratch: Keep your fingernails clean and cut short. Wear gloves or mittens when you sleep, if scratching is a problem. General instructions Rest as told by your doctor. Wash your hands often with soap and water for at least 20 seconds. If you cannot use soap and water, use hand sanitizer. Doing this lowers your chance of getting a skin infection. Your infection can cause chickenpox in people who have never had chickenpox or never got a chickenpox vaccine shot. If you have blisters that did not change into scabs yet, try not to touch other people or be around other people, especially: Babies. Pregnant women. Children who have areas of red, itchy, or rough skin (eczema). Older people who have organ transplants. People who have a long-term (chronic) illness, like cancer or AIDS. Keep all follow-up visits. How is this prevented? A vaccine shot is the best way to prevent shingles and protect against shingles problems. If you have not had a vaccine shot, talk with your doctor about getting it. Where to find more information Centers for Disease Control and Prevention: [employer]Exhibition.com.br Contact a doctor if: Your pain does not get better with medicine. Your pain does not get better after the rash heals. You have any of these signs of  infection around the rash: More redness, swelling, or pain. Fluid or blood. Warmth. Pus or a bad smell. You have a fever. Get help right away if: The rash is on your face or nose. You have pain in your face or pain by your eye. You lose feeling on one side of your face. You have trouble seeing. You have ear pain, or you have ringing in your ear. You have a loss of taste. Your condition gets worse. Summary Shingles gives you a painful skin rash and blisters that have fluid in them. Shingles is caused by the same germ (virus) that causes chickenpox. Keep your rash covered with a loose bandage. Wear loose clothing that does not rub on your rash. If you have blisters that did not change into scabs yet, try not to touch other people or be around people. This information is not intended to replace advice given to you by your health care provider. Make sure you discuss any questions you have with your health care provider. Document Revised: 10/14/2020 Document Reviewed: 10/14/2020 Elsevier Patient Education  2024 [employer]    If you have been instructed to  have an in-person evaluation today at a local Urgent Care facility, please use the link below. It will take you to a list of all of our available [facility], including address, phone number and hours of operation. Please do not delay care.  [facility]  If you or a family member do not have a primary care provider, use the link below to schedule a visit and establish care. When you choose a [facility] primary care physician or advanced practice provider, you gain a long-term partner in health. Find a Primary Care Provider  Learn more about [facility] in-office and virtual care options:  - Get Care Now

## 2024-04-17 ENCOUNTER — Emergency Department
Admission: EM | Admit: 2024-04-17 | Discharge: 2024-04-17 | Disposition: A | Discharge status: Home / Self Care | Attending: Emergency Medicine | Admitting: Emergency Medicine

## 2024-04-17 ENCOUNTER — Other Ambulatory Visit: Payer: Self-pay

## 2024-04-17 ENCOUNTER — Emergency Department

## 2024-04-17 ENCOUNTER — Encounter: Payer: Self-pay | Admitting: Emergency Medicine

## 2024-04-17 DIAGNOSIS — M25512 Pain in left shoulder: Secondary | ICD-10-CM | POA: Diagnosis present

## 2024-04-17 DIAGNOSIS — R0602 Shortness of breath: Secondary | ICD-10-CM | POA: Diagnosis not present

## 2024-04-17 LAB — COMPREHENSIVE METABOLIC PANEL WITH GFR
ALT: 13 U/L (ref 0–44)
AST: 16 U/L (ref 15–41)
Albumin: 3.7 g/dL (ref 3.5–5.0)
Alkaline Phosphatase: 41 U/L (ref 38–126)
Anion gap: 7 (ref 5–15)
BUN: 14 mg/dL (ref 6–20)
CO2: 23 mmol/L (ref 22–32)
Calcium: 8.6 mg/dL — ABNORMAL LOW (ref 8.9–10.3)
Chloride: 108 mmol/L (ref 98–111)
Creatinine, Ser: 0.77 mg/dL (ref 0.44–1.00)
GFR, Estimated: >60 mL/min (ref >60)
Glucose, Bld: 77 mg/dL (ref 70–99)
Potassium: 3.9 mmol/L (ref 3.5–5.1)
Sodium: 138 mmol/L (ref 135–145)
Total Bilirubin: 1 mg/dL (ref 0.0–1.2)
Total Protein: 6.5 g/dL (ref 6.5–8.1)

## 2024-04-17 LAB — CBC WITH DIFFERENTIAL/PLATELET
Abs Immature Granulocytes: 0.01 10*3/uL (ref 0.00–0.07)
Basophils Absolute: 0 10*3/uL (ref 0.0–0.1)
Basophils Relative: 0 %
Eosinophils Absolute: 0.1 10*3/uL (ref 0.0–0.5)
Eosinophils Relative: 1 %
HCT: 40.6 % (ref 36.0–46.0)
Hemoglobin: 13.7 g/dL (ref 12.0–15.0)
Immature Granulocytes: 0 %
Lymphocytes Relative: 36 %
Lymphs Abs: 2.8 10*3/uL (ref 0.7–4.0)
MCH: 30.2 pg (ref 26.0–34.0)
MCHC: 33.7 g/dL (ref 30.0–36.0)
MCV: 89.6 fL (ref 80.0–100.0)
Monocytes Absolute: 0.4 10*3/uL (ref 0.1–1.0)
Monocytes Relative: 5 %
Neutro Abs: 4.5 10*3/uL (ref 1.7–7.7)
Neutrophils Relative %: 58 %
Platelets: 204 10*3/uL (ref 150–400)
RBC: 4.53 MIL/uL (ref 3.87–5.11)
RDW: 12.7 % (ref 11.5–15.5)
WBC: 7.9 10*3/uL (ref 4.0–10.5)
nRBC: 0 % (ref 0.0–0.2)

## 2024-04-17 LAB — D-DIMER, QUANTITATIVE: D-Dimer, Quant: <0.27 ug{FEU}/mL (ref 0.00–0.50)

## 2024-04-17 MED ORDER — NAPROXEN 500 MG PO TABS: 500.0000 mg | ORAL_TABLET | Freq: Two times a day (BID) | ORAL | 0 refills | Status: AC

## 2024-04-17 MED ORDER — METHOCARBAMOL 500 MG PO TABS: 500.0000 mg | ORAL_TABLET | Freq: Three times a day (TID) | ORAL | 0 refills | Status: AC | PRN

## 2024-04-17 NOTE — ED Triage Notes (Signed)
 Presents with pain to left posterior shoulder  States pain started about 2 months ago

## 2024-04-17 NOTE — ED Provider Notes (Signed)
 [facility] Provider Note    Event Date/Time   First MD Initiated Contact with Patient 04/17/24 1558     (approximate)   History   Shoulder Pain   HPI  Janice Huynh is a 23 y.o. female with a history of hypokalemia, iron  deficiency anemia and as listed in EMR presents to the emergency department for treatment and evaluation of subscapular pain on the left side that has been present for the past month and a half.  Pain increases with deep breath, laughing, cough and sometimes movement.  No history of PE/DVT.  She has an Implanon.  She reports occasional shortness of breath but no chest pain.  She does not recall any injury that would cause this pain.  Her grandmother has given her some back and body ache tablets that have not helped.      Physical Exam   Triage Vital Signs: ED Triage Vitals  Encounter Vitals Group     BP 04/17/24 1604 103/66     Girls Systolic BP Percentile --      Girls Diastolic BP Percentile --      Boys Systolic BP Percentile --      Boys Diastolic BP Percentile --      Pulse Rate 04/17/24 1604 78     Resp 04/17/24 1604 18     Temp 04/17/24 1604 98 F (36.7 C)     Temp Source 04/17/24 1604 Oral     SpO2 04/17/24 1604 100 %     Weight 04/17/24 1605 145 lb (65.8 kg)     Height 04/17/24 1605 5' 2 (1.575 m)     Head Circumference --      Peak Flow --      Pain Score 04/17/24 1605 4     Pain Loc --      Pain Education --      Exclude from Growth Chart --     Most recent vital signs: Vitals:   04/17/24 1604  BP: 103/66  Pulse: 78  Resp: 18  Temp: 98 F (36.7 C)  SpO2: 100%    General: Awake, no distress.  CV:  Good peripheral perfusion.  Resp:  Normal effort. Breath sounds clear Abd:  No distention.  Other:     ED Results / Procedures / Treatments   Labs (all labs ordered are listed, but only abnormal results are displayed) Labs Reviewed  COMPREHENSIVE METABOLIC PANEL WITH GFR - Abnormal; Notable for the  following components:      Result Value   Calcium 8.6 (*)    All other components within normal limits  CBC WITH DIFFERENTIAL/PLATELET  D-DIMER, QUANTITATIVE     EKG     RADIOLOGY  Image and radiology report reviewed and interpreted by me. Radiology report consistent with the same.  Chest x-ray is negative for acute concerns.  PROCEDURES:  Critical Care performed: No  Procedures   MEDICATIONS ORDERED IN ED:  Medications - No data to display   IMPRESSION / MDM / ASSESSMENT AND PLAN / ED COURSE   I have reviewed the triage note.  Differential diagnosis includes, but is not limited to, musculoskeletal strain, pneumonia, PE  Patient's presentation is most consistent with acute presentation with potential threat to life or bodily function.  23 year old female presenting to the emergency department for treatment and evaluation of subscapular pain that started a month and a half ago.  See HPI for for details.  Triage vital signs are normal.  Although pain is likely musculoskeletal, plan will be to get some screening labs, EKG, and chest x-ray to rule out PE. Patient agreeable to the plan.  Chest x-ray and labs including D-dimer are all reassuring.  Results discussed with the patient who feels reassured.  She will be given a prescription for Naprosyn and Robaxin and encouraged to follow-up with primary care if not improving over the next few days.  Medication teaching complete and ER return precautions discussed.      FINAL CLINICAL IMPRESSION(S) / ED DIAGNOSES   Final diagnoses:  Subscapular pain, left     Rx / DC Orders   ED Discharge Orders          Ordered    methocarbamol (ROBAXIN) 500 MG tablet  Every 8 hours PRN        04/17/24 1818    naproxen (NAPROSYN) 500 MG tablet  2 times daily with meals        04/17/24 1818             Note:  This document was prepared using Dragon voice recognition software and may include unintentional dictation  errors.   Sherryle Don, FNP 04/17/24 Lujean Sake, MD 04/17/24 2231

## 2024-04-17 NOTE — Discharge Instructions (Signed)
 Apply heat or ice to the area of pain off-and-on 20 minutes/h while awake.  Follow-up with your primary care provider or return to the emergency department for symptoms of change or worsen.  The muscle relaxer prescribed today may cause dizziness or drowsiness.  You should not drive for at least 8 hours after taking the last dose.
